# Patient Record
Sex: Male | Born: 1955 | Race: White | Hispanic: No | Marital: Married | Smoking: Never smoker
Health system: Southern US, Community
[De-identification: ages and names within clinical notes are randomized; demographics above are authoritative.]

## PROBLEM LIST (undated history)

## (undated) DIAGNOSIS — I10 Essential (primary) hypertension: Secondary | ICD-10-CM

## (undated) DIAGNOSIS — E119 Type 2 diabetes mellitus without complications: Secondary | ICD-10-CM

## (undated) HISTORY — PX: CARDIAC CATHETERIZATION: SHX172

## (undated) HISTORY — PX: OTHER SURGICAL HISTORY: SHX169

## (undated) HISTORY — PX: KNEE ARTHROSCOPY: SUR90

## (undated) HISTORY — DX: Type 2 diabetes mellitus without complications: E11.9

---

## 1997-11-18 ENCOUNTER — Ambulatory Visit (HOSPITAL_BASED_OUTPATIENT_CLINIC_OR_DEPARTMENT_OTHER): Admission: RE | Admit: 1997-11-18 | Discharge: 1997-11-18 | Payer: Self-pay | Admitting: *Deleted

## 2002-06-13 ENCOUNTER — Encounter: Payer: Self-pay | Admitting: Internal Medicine

## 2002-06-13 ENCOUNTER — Inpatient Hospital Stay (HOSPITAL_COMMUNITY): Admission: EM | Admit: 2002-06-13 | Discharge: 2002-06-16 | Payer: Self-pay | Admitting: Emergency Medicine

## 2007-12-15 ENCOUNTER — Ambulatory Visit (HOSPITAL_COMMUNITY): Admission: RE | Admit: 2007-12-15 | Discharge: 2007-12-15 | Payer: Self-pay | Admitting: Orthopedic Surgery

## 2007-12-24 ENCOUNTER — Ambulatory Visit: Payer: Self-pay | Admitting: Cardiology

## 2007-12-29 ENCOUNTER — Ambulatory Visit: Payer: Self-pay

## 2008-01-01 ENCOUNTER — Ambulatory Visit (HOSPITAL_COMMUNITY): Admission: RE | Admit: 2008-01-01 | Discharge: 2008-01-01 | Payer: Self-pay | Admitting: Orthopedic Surgery

## 2009-01-15 DIAGNOSIS — E669 Obesity, unspecified: Secondary | ICD-10-CM | POA: Insufficient documentation

## 2009-01-15 DIAGNOSIS — I2510 Atherosclerotic heart disease of native coronary artery without angina pectoris: Secondary | ICD-10-CM | POA: Insufficient documentation

## 2009-01-15 DIAGNOSIS — I10 Essential (primary) hypertension: Secondary | ICD-10-CM | POA: Insufficient documentation

## 2009-01-15 DIAGNOSIS — E119 Type 2 diabetes mellitus without complications: Secondary | ICD-10-CM | POA: Insufficient documentation

## 2009-01-15 DIAGNOSIS — R079 Chest pain, unspecified: Secondary | ICD-10-CM | POA: Insufficient documentation

## 2009-02-05 IMAGING — CR DG CHEST 2V
2 series · 2 of 2 positions shown · non-contrast
Comparison: None

CLINICAL DATA: Heel cord contracture.  Preadmit.

CHEST - 2 VIEW

[view not recorded (1 of 2)]
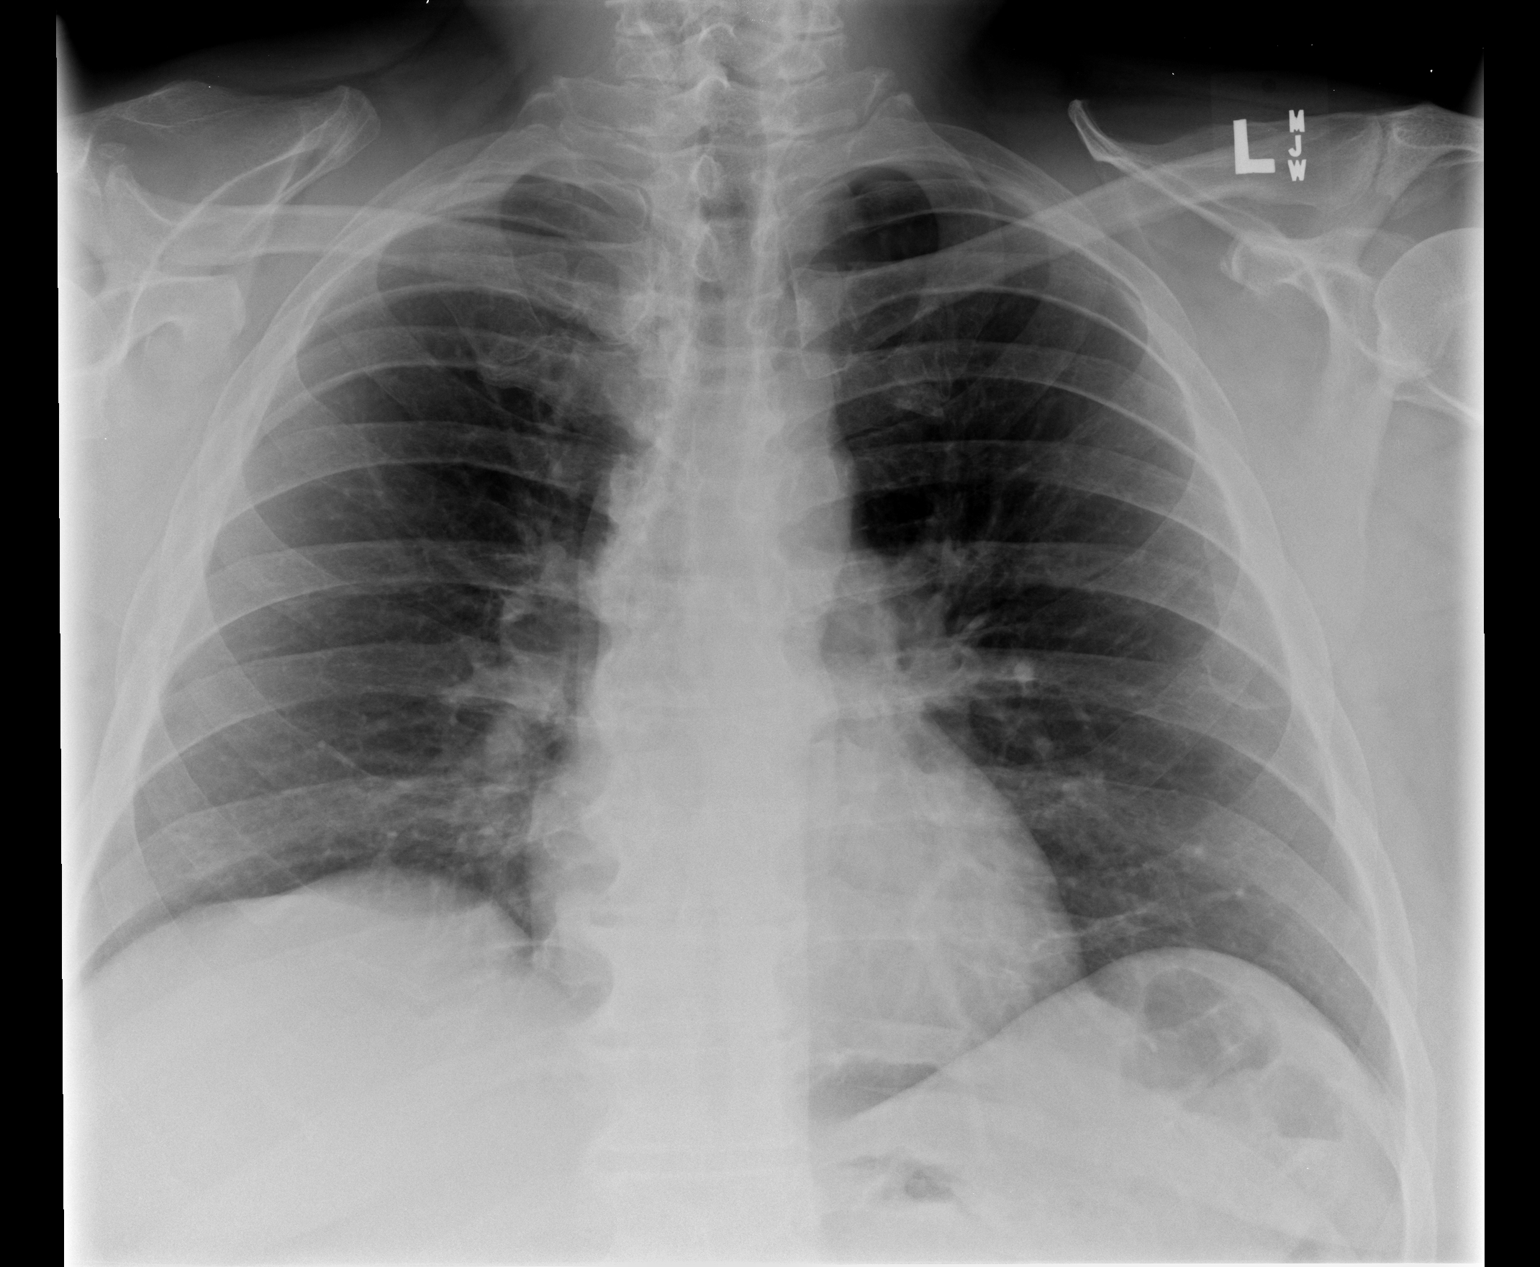

[view not recorded (2 of 2)]
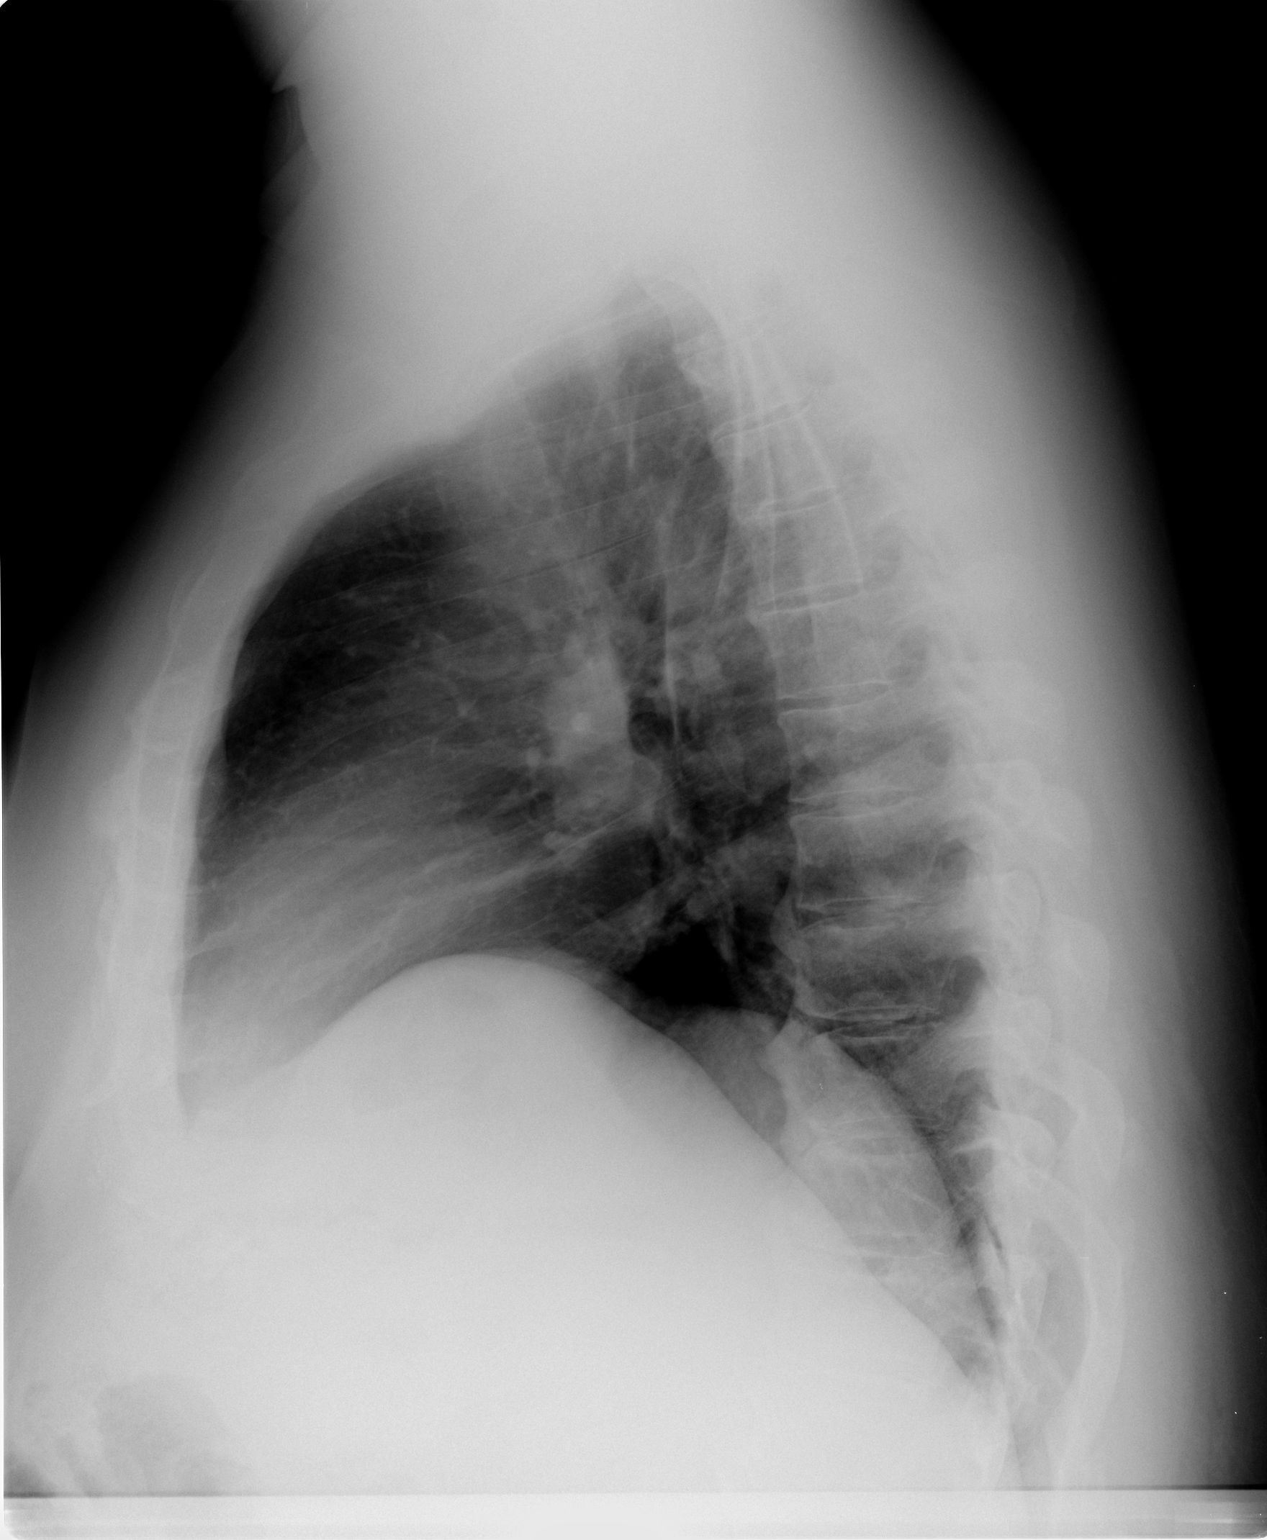

[2 of 2 positions shown; findings below may reference images not displayed]

FINDINGS: Cardiac and mediastinal contours are normal.  There is no
heart failure and the lungs are clear.
IMPRESSION: No active cardiopulmonary disease.

## 2009-02-22 ENCOUNTER — Telehealth (INDEPENDENT_AMBULATORY_CARE_PROVIDER_SITE_OTHER): Payer: Self-pay | Admitting: *Deleted

## 2010-09-12 NOTE — Assessment & Plan Note (Signed)
[facility]                          [facility] OFFICE NOTE   JERRYL, HOLZHAUER                    MRN:          [id number]  DATE:12/24/2007                            DOB:          06/04/1955    REFERRING PHYSICIAN:  Nadara Mustard, MD, [place], [place].   PRIMARY CARE PHYSICIAN:  Dr. Selinda Flavin.   REASON FOR VISIT:  Preop clearance.   HISTORY OF PRESENT ILLNESS:  Mr. Bogden is a very pleasant 55 year old  male patient with a history of probable acute coronary syndrome back in  February 2004.  At that time, he presented with troponins in the 0.13  range.  Cardiac catheterization revealed normal LV function and  scattered disease throughout the LAD.  Specifically, he had a 30-40%  proximal lesion, 30-40% mid lesion and a lesion after the second  diagonal that did not exceed 50%.  The scattered irregularities were  somewhat worrisome and a followup functional test was recommended,  although this was not done at that time.  There was some hypothesis in  his records that indicated he possibly had a transient plaque rupture.  He also has a history of diabetes mellitus, hypertension,  hyperlipidemia.  He was last seen in the office by Dr. Jens Som in June  2004.  He complained of symptoms that sound like an anxiety attack.  He  also noted some dyspnea.  He was set up for followup Cardiolite testing.  This was done in the [place] office and he had a small area of  inferobasal wall ischemia with an EF of 53%.  No scar was noted.  We  have not seen the patient since that time.  He has had significant  problems with recurring diabetic ulcers on his right foot.  This has  been attributed to decreased range of motion from shortening of his  gastrocnemius muscle.  Surgery is planned to correct this.  He  apparently had a preop workup with an abnormal EKG.  His surgery was  cancelled and he was set up for an appointment today.   In  the office, the patient denies any history of chest pain.  He is  quite active without chest heaviness or tightness.  He denies any  exertional jaw or arm discomfort.  He denies any exertional shortness of  breath.  Denies orthopnea or PND.  He denies syncope or palpitations.  He denies any significant pedal edema.   PAST MEDICAL HISTORY:  As outlined above.  In addition, the patient has  a history of left knee surgery x3.   MEDICATIONS:  Metformin 1 g b.i.d., Glipizide 10 mg b.i.d., Metoprolol  50 mg 1/2 tablet b.i.d., Lisinopril 20 mg 1/2 tablet daily,  HCTZ 25 mg daily, Pravastatin 20 mg daily, Multivitamin daily, Aspirin  mg 81 mg daily.   ALLERGIES:  He has reported tachycardia in response to IV DYE in the  past.   SOCIAL HISTORY:  Denies cigarette abuse.  He does note smokeless tobacco  use.  He denies alcohol abuse.  He is married.  He has no children.  He  is unemployed  currently, but has worked as a Aeronautical engineer.   FAMILY HISTORY:  Significant for premature CAD.  His father passed away  at 47 from myocardial infarction.   REVIEW OF SYSTEMS:  Please see HPI.  He denies any fevers, chills,  cough, melena, hematochezia, hematuria, or dysuria.  Rest of the review  of systems are negative.   PHYSICAL EXAMINATION:  GENERAL:  He is a well-nourished and well-  developed male in no distress.  VITAL SIGNS:  Blood pressure is 124/72, pulse 92, weight 284.8 pounds.  HEENT:  Normal.  NECK:  Without JVD.  LYMPH:  Without lymphadenopathy.  ENDOCRINE:  Without thyromegaly.  CARDIAC:  Normal S1 and S2.  Regular rate and rhythm.  LUNGS:  Clear to auscultation bilaterally.  ABDOMEN:  Soft and nontender with normoactive bowel sounds.  No  organomegaly.  EXTREMITIES:  Without edema.  NEUROLOGIC:  He is alert and oriented x3.  Cranial nerves II through XII  grossly intact.  VASCULAR:  Without carotid bruits bilaterally.   Electrocardiogram reveals sinus rhythm, heart rate of 93,  no acute  changes.   ASSESSMENT AND PLAN:  Mr. Figueira is 55 year old male with history of  moderate nonobstructive coronary artery disease by catheterization in  2004 and mildly abnormal Cardiolite study 4 months later, who also has a  history of diabetes mellitus, hypertension, hyperlipidemia and a strong  family history of CAD who is now in need of surgery on his right  gastrocnemius muscle for recurring diabetic ulcers.  He is not having  any symptoms of chest pain or shortness of breath.  In any event, he  certainly has several risk factors as well as a history of coronary  artery disease.  I discussed this case today with Dr. Diona Browner.  We feel  that the patient should undergo Myoview testing prior to clearing him  for surgery.  This has been discussed with the patient, he agrees to  proceed.  We will try to arrange his study as soon as possible and  communicate the results with Dr. Lajoyce Corners when they are made available.  He  is on a good medical regimen.  His metoprolol can certainly be increased  to 50 mg twice a day for better heart rate control, especially in the  perioperative period.  Therefore, I have asked him to  go ahead and make the increase today.  We will bring him back in routine  followup in 6 months with Dr. Diona Browner.  He will return sooner if his  stress test is markedly abnormal.      Tereso Newcomer, PA-C  Electronically Signed      Jonelle Sidle, MD  Electronically Signed   SW/MedQ  DD: 12/24/2007  DT: 12/25/2007  Job #: [id number]   cc:   Nadara Mustard, MD  Selinda Flavin

## 2010-09-12 NOTE — Op Note (Signed)
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   PROCEDURE:  Gastrocnemius recession on the right.   SURGEON:  Nadara Mustard, MD   ANESTHESIA:  General.   ESTIMATED BLOOD LOSS:  Minimal.   ANTIBIOTICS:  A 1 g of Kefzol.   DRAINS:  None.   COMPLICATIONS:  None.   TOURNIQUET TIME:  None.   DISPOSITION:  To PACU in stable condition.   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  He has failed conservative  treatment with attempted heel cord stretching.  He has dorsiflexion 10  degrees short in neutral on the right with dorsiflexion of 20 degrees on  the left. Due to failure of conservative care and pain with activities  of daily living across the forefoot and midfoot, the patient presents at  this time for gastrocnemius recession.  Risks and benefits were  discussed including infection, neurovascular injury, persistent pain,  and need for additional surgery.  The patient states he understands and  wished to proceed at this time.   DESCRIPTION OF PROCEDURE:  The patient was brought to OR room #4 and  underwent a general anesthetic.  After adequate level of anesthesia was  obtained, the patient's right lower extremity was prepped using DuraPrep  and draped into a sterile field.  An incision approximately 2 cm in  length was made longitudinally on the medial aspect of the right leg 15  cm proximal to the medial malleolar tip.  Blunt dissection was carried  down to the  gastrocnemius fascia.  This was resected sharply with a #15  blade knife.  The patient's dorsiflexion went from 10 degrees short in  neutral to dorsiflexion of about 30 degrees.  There was no bleeding.  The wound was irrigated with normal saline.  The skin was closed using  modified vertical mattress suture.  The wound was covered with Adaptic,  orthopedic sponges, sterile Webril, Coban dressing.  The patient was  then extubated, taken to PACU in stable condition.   Plan for discharge to home.  Prescription for Vicodin.  Weightbearing as  tolerated.  Follow up in the office in 2 weeks.      Nadara Mustard, MD  Electronically Signed     MVD/MEDQ  D:  01/01/2008  T:  01/01/2008  Job:  [id number]

## 2010-09-15 NOTE — H&P (Signed)
NAME:  Michael Cantu, Michael Cantu                       ACCOUNT NO.:  [id number]   MEDICAL RECORD NO.:  [id number]                   PATIENT TYPE:  INP   LOCATION:  1823                                 FACILITY:  [facility]   PHYSICIAN:  Doylene Canning. Ladona Ridgel, M.D. [facility]           DATE OF BIRTH:  Aug 23, 1955   DATE OF ADMISSION:  06/13/2002  DATE OF DISCHARGE:                                HISTORY & PHYSICAL   ADMITTING DIAGNOSIS:  Unstable angina with new onset chest pain.   HISTORY OF PRESENT ILLNESS:  The patient is sent in transfer from [facility] with primary physician, Dr. Selinda Flavin.   HISTORY OF PRESENT ILLNESS:  The patient is a 55 year old obese man with  multiple cardiac risk factors who was in his usual state of health when he  today developed mid-sternal, substernal chest pain which was relieved with  nitroglycerin in the emergency department.  The patient for his pain tried  antiacids, but this did not help his symptoms.  His pain subsequently  worsened, and  he presents to the emergency room with nitroglycerin paste,  resulting in resolution of his pain.  The patient denies any pleuritic  symptoms.  The patient notes that his pain occurred after an argument with  his wife.  He denies any nausea, vomiting, diarrhea or constipation.  He  denies any left arm, neck or jaw pain.   MEDICATIONS:  1. Actos.  2. Accuretic.  3. Glucotrol.  4. Prandin.  5. Metformin.   PAST MEDICAL HISTORY:  Notable for diabetes since 1995.  This has been  complicated by a diabetic foot ulcer.  He has had multiple knees surgeries.  He has hypertension.  He has morbid obesity.   ALLERGIES:  Dye resulting in an increase in his heart rate.   SOCIAL HISTORY:  The patient is married.  He dips tobacco.  He denies  ethanol abuse.  He denies much in the way of exercise.   FAMILY HISTORY:  Notable for mother who is age 42, having diabetes and  palpitations.  His father died suddenly at age 51 of a  massive myocardial  infarction.  He has two sisters who are alive and well.  He denies drug use.   REVIEW OF SYSTEMS:  He denies fevers, chills, night sweats or other  constitutional symptoms.  He denies vision or hearing problems, nasal  discharge or voice changes.  He denies any skin rashes or lesions.  He has  chest pain as previously noted.  He denies shortness of breath, dyspnea or  PND.  He does have rare palpitations with dizziness.  He denies cough or  hemoptysis.  He denies dysuria, hematuria or nocturia.  He denies weakness,  numbness or depression.  He has been under increasing stress lately  secondary to a new job, and problems with arguing with his wife.  He denies  arthralgias, arthritis or joint pain.  He denies nausea,  vomiting, diarrhea  or constipation.  He denies any recent weight changes.  The rest of his  review of systems is negative.   PHYSICAL EXAMINATION:  GENERAL:  He is a pleasant, well-appearing middle-  aged obese man, in no distress.  VITAL SIGNS:  Blood pressure 140/70, pulse 100 and regular.  Respirations  20.  Temperature 97.3.  HEENT:  Normocephalic, atraumatic.  Pupils are equal and round.  The  oropharynx is moist.  Sclerae were anicteric.  NECK:  Revealed no jugular venous distention.  There was no thyromegaly.  CARDIOVASCULAR:  Regular rate and rhythm with normal S1 and S2.  There were  no murmurs, rubs or gallops.  LUNGS:  Clear bilaterally to auscultation.  There are no wheezes, rales or  rhonchi.  SKIN:  No rashes or lesions.  ABDOMEN:  Exam was obese, nontender, nondistended.  There is no  organomegaly.  EXTREMITIES:  Demonstrated no cyanosis, clubbing or edema.  NEUROLOGIC:  He is alert and oriented x2 with Cranial nerves II-XII grossly  intact.  Strength 5/5 and symmetric.   EKG:  Demonstrates normal sinus rhythm, with nonspecific T wave abnormality.  Creatinine is normal.  Hemoglobin was normal.  Initial cardiac enzymes  revealed a CK of  102, and MB of 2.4 with initial troponin of 0.02.   IMPRESSION:  1. New onset chest pain, worrisome for unstable angina.  2. Diabetes, on oral agents.  3. Hypertension.  4. Obesity.  5. Strong family history of coronary disease.   DISCUSSION:  We will plan to admit the patient to the hospital and obtain  serial EKG's and cardiac enzymes.  We will give IV nitroglycerin and  heparin.  We will start aspirin and beta-blockers.  Because of his morbid  obesity, we will plan on proceeding with left heart catheterization.                                               Doylene Canning. Ladona Ridgel, M.D. [facility]    GWT/MEDQ  D:  06/13/2002  T:  06/13/2002  Job:  [id number]   cc:   Selinda Flavin  [address]  [place]  [place] [address]  Fax: [phone]

## 2010-09-15 NOTE — Discharge Summary (Signed)
   NAME:  Michael Cantu, Michael Cantu                       ACCOUNT NO.:  [id number]   MEDICAL RECORD NO.:  [id number]                   PATIENT TYPE:  INP   LOCATION:  3708                                 FACILITY:  [facility]   PHYSICIAN:  Doylene Canning. Ladona Ridgel, M.D. [facility]           DATE OF BIRTH:  1955-07-15   DATE OF ADMISSION:  06/13/2002  DATE OF DISCHARGE:  06/16/2002                                 DISCHARGE SUMMARY   PRINCIPAL DIAGNOSIS:  Chest pain.   SECONDARY DIAGNOSES:  1. Obesity.  2. Diabetes.   HISTORY OF PRESENT ILLNESS:  This is a 55 year old male with multiple  coronary risk factors who was in his initial state of health when he  developed midsternal, substernal chest pain which was relieved by  nitroglycerin in the emergency room.  The patient had tried antacids but  this did not help.  Symptoms subsequently worsened.  He presented to the  emergency room with nitro paste resulting in resolution of pain.  He denies  any pleuritic symptoms, notes that pain occurred after an argument with his  wife.  Denies nausea, vomiting, diarrhea, constipation.   PAST MEDICAL HISTORY:  As stated above.   Patient was admitted and underwent a cardiac catheterization.  Cardiac  catheterization reveals overall preserved LV systolic function, severe  moderate segmental abnormalities of the left coronary, left anterior  descending artery.  The patient does have some scattered irregularities.  Medical therapy was decided.   DISPOSITION:  He was discharged to home in stable condition on the following  medications.   1. Coated aspirin 325 mg daily.  2. Lopressor 25 mg b.i.d.  3. Actos 30 mg daily.  4. Glucotrol 10 mg b.i.d.  5. _______ t.i.d. before meals.  6. Accuretic 20/25 daily.  7. He was allowed to resume his Glucophage on Thursday.  8. Tylenol one to two tablets every four to six hours as needed.   DISCHARGE ACTIVITIES:  No heavy lifting or strenuous activities for four  days, no driving for  two.   DIET:  Low fat, low cholesterol, low cholesterol diet.   DISCHARGE INSTRUCTIONS:  He is to call if he develops any drainage or a lump  in his groin.    FOLLOW UP:  He is scheduled to follow up with the physician's assistant at  [facility] at heart center in [place] on March 2 at 1:45 p.m.      Chinita Pester, C.R.N.P. [facility]                 Doylene Canning. Ladona Ridgel, M.D. [facility]    DS/MEDQ  D:  06/16/2002  T:  06/16/2002  Job:  [id number]   cc:   Selinda Flavin  [address]  [place]  [place] [address]  Fax: [phone]   Heart Center  [place]

## 2010-09-15 NOTE — Cardiovascular Report (Signed)
NAME:  Michael Cantu, Michael Cantu                       ACCOUNT NO.:  [id number]   MEDICAL RECORD NO.:  [id number]                   PATIENT TYPE:  INP   LOCATION:  3708                                 FACILITY:  [facility]   PHYSICIAN:  Arturo Morton. Riley Kill, M.D. [facility]         DATE OF BIRTH:  [birth year]   DATE OF PROCEDURE:  06/15/2002  DATE OF DISCHARGE:                              CARDIAC CATHETERIZATION   INDICATIONS:  The patient is a 55 year old gentleman who presented with  chest pain.  He is a smoker.  His electrocardiogram was normal, but he did  have positive troponins x1.  The current study was done to assess coronary  anatomy.   PROCEDURES:  1. Left heart catheterization.  2. Selective coronary arteriography.  3. Selective left ventriculography.   DESCRIPTION OF PROCEDURE:  The procedure was performed from the right  femoral artery using 6 French catheters.  He tolerated the procedure well  and there were no complications. We went back to take more views of the left  main and left anterior descending artery.  The procedure was completed  without complication.   HEMODYNAMIC DATA:  1. Central aortic pressure 119/72 with a mean of 91.  2. Left ventricular pressure 118/8 with a mean of 13.  3. There is no gradient noted on pullback across the aortic valve.   ANGIOGRAPHIC DATA:  1. Ventriculography was performed in the RAO projection.  There is a fair     amount of ventricular ectopy. However, there is no definite wall motion     abnormalities and ejection fraction was greater than 60%.  2. The left main coronary artery was free of critical disease.  3. The LAD courses to the apex.  The LAD takeoff was somewhat difficult to     see and we went ahead and did repeat views of this vessel to highlight     this better. There appears to be perhaps 30-40% narrowing in the proximal     LAD, but it does not appear to be critical.  The mid LAD has about 30-40%     narrowing and there is  narrowing in the LAD just after the second     diagonal branch which is a little bit difficult to grade, but probably     does not exceed 50%.  The distal LAD is a somewhat smaller caliber vessel     with a somewhat typical diabetic appearance of mild bumpy irregularities.  4. There is a large ramus intermedius that is free of critical disease.  5. The circumflex has a small tiny first marginal branch which is small in     caliber.  There is a second and third marginal branch which is moderate     in size and likewise appears to be free of critical disease.  6. The right coronary artery is a large caliber vessel that provides the     posterior descending and posterolateral system.  There is minimal luminal     irregularity throughout the vessel but no high-grade areas of disease.   CONCLUSIONS:  1. Preserved overall left ventricular function.  2. Several moderate segmental abnormalities of the left anterior descending     artery as described above without what appears to be critical narrowing.   DISPOSITION:  The patient does have some scattered irregularities which are  somewhat worrisome.  Discontinuation of smoking would be strongly  recommended as well as lipid-lowering therapy.  Exercise tolerance test to  exclude LAD ischemia would be warranted.  I did review the films with Dr.  Geralynn Rile and he and I are of the same feeling that the LAD does not look  critical.                                                     Arturo Morton. Riley Kill, M.D. [facility]    TDS/MEDQ  D:  06/15/2002  T:  06/15/2002  Job:  [id number]   cc:   CV Laboratory

## 2011-01-31 LAB — CBC
HCT: 44
Hemoglobin: 14.7
MCHC: 33.5
MCV: 84
Platelets: 263
RBC: 5.24
RDW: 14.7
WBC: 9.1

## 2011-01-31 LAB — APTT: aPTT: 28

## 2011-01-31 LAB — COMPREHENSIVE METABOLIC PANEL
ALT: 27
AST: UNDETERMINED
Albumin: 3.6
Alkaline Phosphatase: 88
BUN: 18
CO2: 25
Calcium: 9
Chloride: 100
Creatinine, Ser: 0.96
GFR calc Af Amer: >60
GFR calc non Af Amer: >60
Glucose, Bld: 279 — ABNORMAL HIGH
Potassium: 5
Sodium: 133 — ABNORMAL LOW
Total Bilirubin: UNDETERMINED
Total Protein: 7.1

## 2011-01-31 LAB — PROTIME-INR
INR: 1
Prothrombin Time: 13.2

## 2013-01-13 ENCOUNTER — Encounter (HOSPITAL_COMMUNITY): Payer: Self-pay | Admitting: Pharmacy Technician

## 2013-01-19 NOTE — Patient Instructions (Addendum)
Your procedure is scheduled on: 01/26/2013  Report to [facility] at  800  AM.  Call this number if you have problems the morning of surgery: [phone]   Do not eat food or drink liquids :After Midnight.      Take these medicines the morning of surgery with A SIP OF WATER: lisinopril   Do not wear jewelry, make-up or nail polish.  Do not wear lotions, powders, or perfumes.   Do not shave 48 hours prior to surgery.  Do not bring valuables to the hospital.  Contacts, dentures or bridgework may not be worn into surgery.  Leave suitcase in the car. After surgery it may be brought to your room.  For patients admitted to the hospital, checkout time is 11:00 AM the day of discharge.   Patients discharged the day of surgery will not be allowed to drive home.  :     Please read over the following fact sheets that you were given: Coughing and Deep Breathing, Surgical Site Infection Prevention, Anesthesia Post-op Instructions and Care and Recovery After Surgery    Cataract A cataract is a clouding of the lens of the eye. When a lens becomes cloudy, vision is reduced based on the degree and nature of the clouding. Many cataracts reduce vision to some degree. Some cataracts make people more near-sighted as they develop. Other cataracts increase glare. Cataracts that are ignored and become worse can sometimes look white. The white color can be seen through the pupil. CAUSES   Aging. However, cataracts may occur at any age, even in newborns.   Certain drugs.   Trauma to the eye.   Certain diseases such as diabetes.   Specific eye diseases such as chronic inflammation inside the eye or a sudden attack of a rare form of glaucoma.   Inherited or acquired medical problems.  SYMPTOMS   Gradual, progressive drop in vision in the affected eye.   Severe, rapid visual loss. This most often happens when trauma is the cause.  DIAGNOSIS  To detect a cataract, an eye doctor examines the lens. Cataracts  are best diagnosed with an exam of the eyes with the pupils enlarged (dilated) by drops.  TREATMENT  For an early cataract, vision may improve by using different eyeglasses or stronger lighting. If that does not help your vision, surgery is the only effective treatment. A cataract needs to be surgically removed when vision loss interferes with your everyday activities, such as driving, reading, or watching TV. A cataract may also have to be removed if it prevents examination or treatment of another eye problem. Surgery removes the cloudy lens and usually replaces it with a substitute lens (intraocular lens, IOL).  At a time when both you and your doctor agree, the cataract will be surgically removed. If you have cataracts in both eyes, only one is usually removed at a time. This allows the operated eye to heal and be out of danger from any possible problems after surgery (such as infection or poor wound healing). In rare cases, a cataract may be doing damage to your eye. In these cases, your caregiver may advise surgical removal right away. The vast majority of people who have cataract surgery have better vision afterward. HOME CARE INSTRUCTIONS  If you are not planning surgery, you may be asked to do the following:  Use different eyeglasses.   Use stronger or brighter lighting.   Ask your eye doctor about reducing your medicine dose or changing medicines if  it is thought that a medicine caused your cataract. Changing medicines does not make the cataract go away on its own.   Become familiar with your surroundings. Poor vision can lead to injury. Avoid bumping into things on the affected side. You are at a higher risk for tripping or falling.   Exercise extreme care when driving or operating machinery.   Wear sunglasses if you are sensitive to bright light or experiencing problems with glare.  SEEK IMMEDIATE MEDICAL CARE IF:   You have a worsening or sudden vision loss.   You notice redness,  swelling, or increasing pain in the eye.   You have a fever.  Document Released: 04/16/2005 Document Revised: 04/05/2011 Document Reviewed: 12/08/2010 [facility] Patient Information 2012 [place].PATIENT INSTRUCTIONS POST-ANESTHESIA  IMMEDIATELY FOLLOWING SURGERY:  Do not drive or operate machinery for the first twenty four hours after surgery.  Do not make any important decisions for twenty four hours after surgery or while taking narcotic pain medications or sedatives.  If you develop intractable nausea and vomiting or a severe headache please notify your doctor immediately.  FOLLOW-UP:  Please make an appointment with your surgeon as instructed. You do not need to follow up with anesthesia unless specifically instructed to do so.  WOUND CARE INSTRUCTIONS (if applicable):  Keep a dry clean dressing on the anesthesia/puncture wound site if there is drainage.  Once the wound has quit draining you may leave it open to air.  Generally you should leave the bandage intact for twenty four hours unless there is drainage.  If the epidural site drains for more than 36-48 hours please call the anesthesia department.  QUESTIONS?:  Please feel free to call your physician or the hospital operator if you have any questions, and they will be happy to assist you.

## 2013-01-20 ENCOUNTER — Encounter (HOSPITAL_COMMUNITY)
Admission: RE | Admit: 2013-01-20 | Discharge: 2013-01-20 | Disposition: A | Discharge status: Home / Self Care | Payer: Medicare Other | Source: Ambulatory Visit | Attending: Ophthalmology | Admitting: Ophthalmology

## 2013-01-20 ENCOUNTER — Encounter (HOSPITAL_COMMUNITY): Payer: Self-pay

## 2013-01-20 DIAGNOSIS — Z01810 Encounter for preprocedural cardiovascular examination: Secondary | ICD-10-CM | POA: Insufficient documentation

## 2013-01-20 DIAGNOSIS — Z01812 Encounter for preprocedural laboratory examination: Secondary | ICD-10-CM | POA: Insufficient documentation

## 2013-01-20 DIAGNOSIS — Z01818 Encounter for other preprocedural examination: Secondary | ICD-10-CM | POA: Insufficient documentation

## 2013-01-20 HISTORY — DX: Essential (primary) hypertension: I10

## 2013-01-20 LAB — BASIC METABOLIC PANEL
CO2: 24 mEq/L (ref 19–32)
Chloride: 99 mEq/L (ref 96–112)
Creatinine, Ser: 0.99 mg/dL (ref 0.50–1.35)
GFR calc Af Amer: >90 mL/min (ref >90)
Potassium: 5 mEq/L (ref 3.5–5.1)

## 2013-01-20 LAB — HEMOGLOBIN AND HEMATOCRIT, BLOOD: HCT: 38.9 % — ABNORMAL LOW (ref 39.0–52.0)

## 2013-01-20 NOTE — Progress Notes (Signed)
01/20/13 1234  OBSTRUCTIVE SLEEP APNEA  Have you ever been diagnosed with sleep apnea through a sleep study? No  Do you snore loudly (loud enough to be heard through closed doors)?  0  Do you often feel tired, fatigued, or sleepy during the daytime? 0  Has anyone observed you stop breathing during your sleep? 0  Do you have, or are you being treated for high blood pressure? 1  BMI more than 35 kg/m2? 1  Age over 57 years old? 1  Neck circumference greater than 40 cm/18 inches? 0  Gender: 1  Obstructive Sleep Apnea Score 4  Score 4 or greater  Results sent to PCP

## 2013-01-23 MED ORDER — CYCLOPENTOLATE-PHENYLEPHRINE OP SOLN OPTIME - NO CHARGE
OPHTHALMIC | Status: AC
  Filled 2013-01-23: qty 2

## 2013-01-23 MED ORDER — TETRACAINE 0.5 % OP SOLN OPTIME - NO CHARGE
OPHTHALMIC | Status: AC
  Filled 2013-01-23: qty 2

## 2013-01-26 ENCOUNTER — Encounter (HOSPITAL_COMMUNITY)
Admission: RE | Disposition: A | Discharge status: Home / Self Care | Payer: Self-pay | Source: Ambulatory Visit | Attending: Ophthalmology

## 2013-01-26 ENCOUNTER — Ambulatory Visit (HOSPITAL_COMMUNITY): Payer: Medicare Other | Admitting: Anesthesiology

## 2013-01-26 ENCOUNTER — Encounter (HOSPITAL_COMMUNITY): Payer: Self-pay | Admitting: *Deleted

## 2013-01-26 ENCOUNTER — Ambulatory Visit (HOSPITAL_COMMUNITY)
Admission: RE | Admit: 2013-01-26 | Discharge: 2013-01-26 | Disposition: A | Discharge status: Home / Self Care | Payer: Medicare Other | Source: Ambulatory Visit | Attending: Ophthalmology | Admitting: Ophthalmology

## 2013-01-26 ENCOUNTER — Encounter (HOSPITAL_COMMUNITY): Payer: Self-pay | Admitting: Anesthesiology

## 2013-01-26 DIAGNOSIS — H2510 Age-related nuclear cataract, unspecified eye: Secondary | ICD-10-CM | POA: Insufficient documentation

## 2013-01-26 DIAGNOSIS — Z01812 Encounter for preprocedural laboratory examination: Secondary | ICD-10-CM | POA: Insufficient documentation

## 2013-01-26 DIAGNOSIS — E119 Type 2 diabetes mellitus without complications: Secondary | ICD-10-CM | POA: Insufficient documentation

## 2013-01-26 DIAGNOSIS — I10 Essential (primary) hypertension: Secondary | ICD-10-CM | POA: Insufficient documentation

## 2013-01-26 DIAGNOSIS — Z794 Long term (current) use of insulin: Secondary | ICD-10-CM | POA: Insufficient documentation

## 2013-01-26 HISTORY — PX: CATARACT EXTRACTION W/PHACO: SHX586

## 2013-01-26 LAB — GLUCOSE, CAPILLARY: Glucose-Capillary: 134 mg/dL — ABNORMAL HIGH (ref 70–99)

## 2013-01-26 SURGERY — PHACOEMULSIFICATION, CATARACT, WITH IOL INSERTION
Anesthesia: Monitor Anesthesia Care | Site: Eye | Laterality: Right | Wound class: Clean

## 2013-01-26 MED ORDER — POVIDONE-IODINE 5 % OP SOLN
OPHTHALMIC | Status: DC | PRN
  Administered 2013-01-26: 1 via OPHTHALMIC

## 2013-01-26 MED ORDER — MIDAZOLAM HCL 2 MG/2ML IJ SOLN
INTRAMUSCULAR | Status: AC
  Filled 2013-01-26: qty 2

## 2013-01-26 MED ORDER — NA HYALUR & NA CHOND-NA HYALUR 0.55-0.5 ML IO KIT
PACK | INTRAOCULAR | Status: DC | PRN
  Administered 2013-01-26: 1 via OPHTHALMIC

## 2013-01-26 MED ORDER — CYCLOPENTOLATE-PHENYLEPHRINE 0.2-1 % OP SOLN
1.0000 [drp] | OPHTHALMIC | Status: AC
  Administered 2013-01-26 (×3): 1 [drp] via OPHTHALMIC

## 2013-01-26 MED ORDER — ONDANSETRON HCL 4 MG/2ML IJ SOLN: 4.0000 mg | Freq: Once | INTRAMUSCULAR | Status: DC | PRN

## 2013-01-26 MED ORDER — LIDOCAINE HCL 3.5 % OP GEL
OPHTHALMIC | Status: AC
  Filled 2013-01-26: qty 1

## 2013-01-26 MED ORDER — LACTATED RINGERS IV SOLN
INTRAVENOUS | Status: DC
  Administered 2013-01-26: 1000 mL via INTRAVENOUS

## 2013-01-26 MED ORDER — MIDAZOLAM HCL 5 MG/5ML IJ SOLN
INTRAMUSCULAR | Status: DC | PRN
  Administered 2013-01-26: 2 mg via INTRAVENOUS

## 2013-01-26 MED ORDER — BSS IO SOLN
INTRAOCULAR | Status: DC | PRN
  Administered 2013-01-26: 15 mL via INTRAOCULAR

## 2013-01-26 MED ORDER — EPINEPHRINE HCL 1 MG/ML IJ SOLN
INTRAOCULAR | Status: DC | PRN
  Administered 2013-01-26: 10:00:00

## 2013-01-26 MED ORDER — FENTANYL CITRATE 0.05 MG/ML IJ SOLN
25.0000 ug | INTRAMUSCULAR | Status: AC
  Administered 2013-01-26: 25 ug via INTRAVENOUS

## 2013-01-26 MED ORDER — FENTANYL CITRATE 0.05 MG/ML IJ SOLN: 25.0000 ug | INTRAMUSCULAR | Status: DC | PRN

## 2013-01-26 MED ORDER — MIDAZOLAM HCL 2 MG/2ML IJ SOLN
1.0000 mg | INTRAMUSCULAR | Status: DC | PRN
  Administered 2013-01-26: 2 mg via INTRAVENOUS

## 2013-01-26 MED ORDER — TETRACAINE HCL 0.5 % OP SOLN
OPHTHALMIC | Status: DC | PRN
  Administered 2013-01-26: 1 [drp] via OPHTHALMIC

## 2013-01-26 MED ORDER — EPINEPHRINE HCL 1 MG/ML IJ SOLN
INTRAMUSCULAR | Status: AC
  Filled 2013-01-26: qty 1

## 2013-01-26 MED ORDER — FENTANYL CITRATE 0.05 MG/ML IJ SOLN
INTRAMUSCULAR | Status: AC
  Filled 2013-01-26: qty 2

## 2013-01-26 MED ORDER — LIDOCAINE HCL 3.5 % OP GEL
OPHTHALMIC | Status: DC | PRN
  Administered 2013-01-26: 1 via OPHTHALMIC

## 2013-01-26 SURGICAL SUPPLY — 27 items
CAPSULAR TENSION RING-AMO (OPHTHALMIC RELATED) IMPLANT
CLOTH BEACON ORANGE TIMEOUT ST (SAFETY) ×1 IMPLANT
GLOVE BIO SURGEON STRL SZ7.5 (GLOVE) IMPLANT
GLOVE BIOGEL M 6.5 STRL (GLOVE) IMPLANT
GLOVE BIOGEL PI IND STRL 6.5 (GLOVE) ×1 IMPLANT
GLOVE BIOGEL PI IND STRL 7.0 (GLOVE) ×1 IMPLANT
GLOVE BIOGEL PI INDICATOR 6.5 (GLOVE) ×1
GLOVE BIOGEL PI INDICATOR 7.0 (GLOVE) ×1
GLOVE ECLIPSE 6.5 STRL STRAW (GLOVE) IMPLANT
GLOVE ECLIPSE 7.5 STRL STRAW (GLOVE) IMPLANT
GLOVE EXAM NITRILE LRG STRL (GLOVE) IMPLANT
GLOVE EXAM NITRILE MD LF STRL (GLOVE) IMPLANT
GLOVE SKINSENSE NS SZ6.5 (GLOVE)
GLOVE SKINSENSE NS SZ7.0 (GLOVE)
GLOVE SKINSENSE STRL SZ6.5 (GLOVE) IMPLANT
GLOVE SKINSENSE STRL SZ7.0 (GLOVE) IMPLANT
INST SET CATARACT ~~LOC~~ (KITS) ×2 IMPLANT
KIT VITRECTOMY (OPHTHALMIC RELATED) IMPLANT
PAD ARMBOARD 7.5X6 YLW CONV (MISCELLANEOUS) ×2 IMPLANT
PROC W NO LENS (INTRAOCULAR LENS)
PROC W SPEC LENS (INTRAOCULAR LENS)
PROCESS W NO LENS (INTRAOCULAR LENS) IMPLANT
PROCESS W SPEC LENS (INTRAOCULAR LENS) IMPLANT
RING MALYGIN (MISCELLANEOUS) IMPLANT
SIGHTPATH CAT PROC W REG LENS (Ophthalmic Related) ×2 IMPLANT
VISCOELASTIC ADDITIONAL (OPHTHALMIC RELATED) IMPLANT
WATER STERILE IRR 250ML POUR (IV SOLUTION) ×1 IMPLANT

## 2013-01-26 NOTE — H&P (Signed)
I have reviewed the pre printed H&P, the patient was re-examined, and I have identified no significant interval changes in the patient's medical condition.  There is no change in the plan of care since the history and physical of record. 

## 2013-01-26 NOTE — Anesthesia Preprocedure Evaluation (Signed)
Anesthesia Evaluation  Patient identified by MRN, date of birth, ID band Patient awake    Reviewed: Allergy & Precautions, H&P , NPO status , Patient's Chart, lab work & pertinent test results  Airway Mallampati: II TM Distance: >3 FB     Dental  (+) Teeth Intact   Pulmonary  breath sounds clear to auscultation        Cardiovascular hypertension, Pt. on medications + CAD Rhythm:Regular Rate:Normal     Neuro/Psych    GI/Hepatic   Endo/Other  diabetes, Type 2, Insulin DependentMorbid obesity  Renal/GU      Musculoskeletal   Abdominal   Peds  Hematology   Anesthesia Other Findings   Reproductive/Obstetrics                           Anesthesia Physical Anesthesia Plan  ASA: III  Anesthesia Plan: MAC   Post-op Pain Management:    Induction: Intravenous  Airway Management Planned: Nasal Cannula  Additional Equipment:   Intra-op Plan:   Post-operative Plan:   Informed Consent: I have reviewed the patients History and Physical, chart, labs and discussed the procedure including the risks, benefits and alternatives for the proposed anesthesia with the patient or authorized representative who has indicated his/her understanding and acceptance.     Plan Discussed with:   Anesthesia Plan Comments:         Anesthesia Quick Evaluation

## 2013-01-26 NOTE — Op Note (Signed)
See scanned note.

## 2013-01-26 NOTE — Transfer of Care (Signed)
Immediate Anesthesia Transfer of Care Note  Patient: Michael Cantu  Procedure(s) Performed: Procedure(s) with comments: CATARACT EXTRACTION PHACO AND INTRAOCULAR LENS PLACEMENT (IOC) (Right) - CDE:17.43  Patient Location: Short Stay  Anesthesia Type:MAC  Level of Consciousness: awake, alert , oriented and patient cooperative  Airway & Oxygen Therapy: Patient Spontanous Breathing and Patient connected to nasal cannula oxygen  Post-op Assessment: Report given to PACU RN, Post -op Vital signs reviewed and stable and Patient moving all extremities  Post vital signs: Reviewed and stable  Complications: No apparent anesthesia complications

## 2013-01-26 NOTE — Anesthesia Postprocedure Evaluation (Signed)
  Anesthesia Post-op Note  Patient: Michael Cantu  Procedure(s) Performed: Procedure(s) with comments: CATARACT EXTRACTION PHACO AND INTRAOCULAR LENS PLACEMENT (IOC) (Right) - CDE:17.43  Patient Location: Short Stay  Anesthesia Type:MAC  Level of Consciousness: awake, alert , oriented and patient cooperative  Airway and Oxygen Therapy: Patient Spontanous Breathing  Post-op Pain: none  Post-op Assessment: Post-op Vital signs reviewed, Patient's Cardiovascular Status Stable, Respiratory Function Stable, Patent Airway, No signs of Nausea or vomiting and Pain level controlled  Post-op Vital Signs: Reviewed and stable  Complications: No apparent anesthesia complications

## 2013-01-26 NOTE — Brief Op Note (Signed)
01/26/2013  10:11 AM  PATIENT:  Mickel Fuchs  57 y.o. male  PRE-OPERATIVE DIAGNOSIS:  nuclear cataract right eye; decreased vision right eye; difficulty performing daily activities  POST-OPERATIVE DIAGNOSIS:  nuclear cataract right eye; decreased vision right eye;   PROCEDURE:  Procedure(s): CATARACT EXTRACTION PHACO AND INTRAOCULAR LENS PLACEMENT (IOC)  SURGEON:  Surgeon(s): Susa Simmonds, MD  ASSISTANTS: Sherrell Puller   ANESTHESIA STAFF: Anesthesiologist: Laurene Footman, MD CRNA: Despina Hidden, CRNA  ANESTHESIA:   topical and MAC  REQUESTED LENS POWER:18.5  LENS IMPLANT INFORMATION: Alcon [id number] s/n [id number]  Exp 05/2015  CUMULATIVE DISSIPATED ENERGY:17.43  INDICATIONS:see office H&P on chart  OP FINDINGS:dense NS  COMPLICATIONS:None  DICTATION #: see scanned note  PLAN OF CARE: as above   PATIENT DISPOSITION:  Short Stay

## 2013-01-27 ENCOUNTER — Encounter (HOSPITAL_COMMUNITY): Payer: Self-pay | Admitting: Ophthalmology

## 2013-03-02 NOTE — H&P (Signed)
Please see scanned office H&P

## 2013-12-04 ENCOUNTER — Ambulatory Visit (HOSPITAL_COMMUNITY)
Admission: RE | Admit: 2013-12-04 | Discharge: 2013-12-04 | Disposition: A | Discharge status: Home / Self Care | Payer: Medicare Other | Source: Ambulatory Visit | Attending: Surgery | Admitting: Surgery

## 2013-12-04 NOTE — Discharge Instructions (Signed)
PICC Insertion, Care After  Refer to this sheet in the next few weeks. These instructions provide you with information on caring for yourself after your procedure. Your health care provider may also give you more specific instructions. Your treatment has been planned according to current medical practices, but problems sometimes occur. Call your health care provider if you have any problems or questions after your procedure.  WHAT TO EXPECT AFTER THE PROCEDURE  After your procedure, it is typical to have the following:   Mild discomfort at the insertion site. This should not last more than a day.  HOME CARE INSTRUCTIONS   Rest at home for the remainder of the day after the procedure.   You may bend your arm and move it freely. If your PICC is near or at the bend of your elbow, avoid activity with repeated motion at the elbow.   Avoid lifting heavy objects as instructed by your health care provider.   Avoid using a crutch with the arm on the same side as your PICC. You may need to use a walker.  Bandage Care   Keep your PICC bandage (dressing) clean and dry to prevent infection.   Ask your health care provider when you may shower. To keep the dressing dry, cover the PICC with plastic wrap and tape before showering. If the dressing does become wet, replace it right after the shower.   Do not soak in the bath, swim, or use hot tubs when you have a PICC.   Change the PICC dressing as instructed by your health care provider.   Change your PICC dressing if it becomes loose or wet.  General PICC Care   Check the PICC insertion site daily for leakage, redness, swelling, or pain.   Flush the PICC as directed by your health care provider. Let your health care provider know right away if the PICC is difficult to flush or does not flush. Do not use force to flush the PICC.   Do not use a syringe that is less than 10 mL to flush the PICC.   Never pull or tug on the PICC.   Avoid blood pressure checks on the arm  with the PICC.   Keep your PICC identification card with you at all times.   Do not take the PICC out yourself. Only a trained health care professional should remove the PICC.  SEEK MEDICAL CARE IF:   You have pain in your arm, ear, face, or teeth.   You have fever or chills.   You have drainage from the PICC insertion site.   You have redness or palpate a "cord" around the PICC insertion site.   You cannot flush the catheter.  SEEK IMMEDIATE MEDICAL CARE IF:   You have swelling in the arm in which the PICC is inserted.  Document Released: 02/04/2013 Document Revised: 04/21/2013 Document Reviewed: 02/04/2013  [employer] Patient Information 2015 [employer]. This information is not intended to replace advice given to you by your health care provider. Make sure you discuss any questions you have with your health care provider.

## 2014-05-17 DIAGNOSIS — E08621 Diabetes mellitus due to underlying condition with foot ulcer: Secondary | ICD-10-CM | POA: Diagnosis not present

## 2014-06-09 DIAGNOSIS — E11621 Type 2 diabetes mellitus with foot ulcer: Secondary | ICD-10-CM | POA: Diagnosis not present

## 2014-06-23 ENCOUNTER — Ambulatory Visit (INDEPENDENT_AMBULATORY_CARE_PROVIDER_SITE_OTHER): Payer: Medicare Other | Admitting: Podiatry

## 2014-06-23 ENCOUNTER — Encounter: Payer: Self-pay | Admitting: Podiatry

## 2014-06-23 ENCOUNTER — Other Ambulatory Visit: Payer: Self-pay | Admitting: Podiatry

## 2014-06-23 ENCOUNTER — Ambulatory Visit (INDEPENDENT_AMBULATORY_CARE_PROVIDER_SITE_OTHER): Payer: Medicare Other

## 2014-06-23 VITALS — BP 136/86 | HR 69 | Resp 12

## 2014-06-23 DIAGNOSIS — B351 Tinea unguium: Secondary | ICD-10-CM | POA: Diagnosis not present

## 2014-06-23 DIAGNOSIS — L89891 Pressure ulcer of other site, stage 1: Secondary | ICD-10-CM | POA: Diagnosis not present

## 2014-06-23 DIAGNOSIS — R52 Pain, unspecified: Secondary | ICD-10-CM

## 2014-06-23 DIAGNOSIS — L97421 Non-pressure chronic ulcer of left heel and midfoot limited to breakdown of skin: Secondary | ICD-10-CM

## 2014-06-23 NOTE — Progress Notes (Signed)
Subjective:     Patient ID: Michael Cantu, male   DOB: 1955/12/17, 59 y.o.   MRN: [id number]  HPI patient states these had wound care for an open area left for about 9 months but it's doing much better now but they're try to prevent this from occurring again and he did lose his big toe right second toe right and he's had a long-term 20 year history of diabetes that was in poor control and currently is improved but still runs an A1c of around 8 point with long-term neuropathy   Review of Systems  All other systems reviewed and are negative.      Objective:   Physical Exam  Constitutional: He is oriented to person, place, and time.  Musculoskeletal: Normal range of motion.  Neurological: He is oriented to person, place, and time.  Skin: Skin is warm and dry.   I noted diminishment of vascular status which I questioned him about he states he sees a vascular doctor but there is nothing they can do to improve his distal circulation. He does have on his left foot small open area on the lateral side that has Iodosorb on it that measures about 1 cm x 1 cm. It is localized with no proximal edema erythema drainage and no odor noted. The right has healed well secondary to removal of the first metatarsal right hallux proximally one half years ago     Assessment:     Ulceration left lateral foot that's doing much better and healed on the right    Plan:     H&P and x-ray reviewed and at this time I recommended cleaning the area up and reapplied Iodosorb today with sterile dressing and diabetic shoes. This should heal uneventfully but because of circulatory issues at neuropathy and long-term diabetes he is can have to be very diligent in inspecting his feet every day which I explained to him today casted for diabetic shoes

## 2014-06-23 NOTE — Progress Notes (Signed)
   Subjective:    Patient ID: Michael Cantu, male    DOB: 07/25/1955, 59 y.o.   MRN: [id number]  HPI PT STATED WOUND CARE FOR ABOUT 9 MONTHS FOR THE LT FOOT HAVE AN OPEN SORE ON THE LATERAL SIDE BUT IS GETTING OR LOOKING MUCH BETTER. TRIED USING IOSORB AND IT HELP SOME.   Review of Systems  All other systems reviewed and are negative.      Objective:   Physical Exam        Assessment & Plan:

## 2014-07-05 DIAGNOSIS — E11621 Type 2 diabetes mellitus with foot ulcer: Secondary | ICD-10-CM | POA: Diagnosis not present

## 2014-08-25 ENCOUNTER — Ambulatory Visit (INDEPENDENT_AMBULATORY_CARE_PROVIDER_SITE_OTHER): Payer: Medicare Other | Admitting: Podiatry

## 2014-08-25 DIAGNOSIS — L97421 Non-pressure chronic ulcer of left heel and midfoot limited to breakdown of skin: Secondary | ICD-10-CM

## 2014-08-25 DIAGNOSIS — M2010 Hallux valgus (acquired), unspecified foot: Secondary | ICD-10-CM

## 2014-08-25 DIAGNOSIS — R52 Pain, unspecified: Secondary | ICD-10-CM

## 2014-08-25 NOTE — Patient Instructions (Signed)

## 2014-08-25 NOTE — Progress Notes (Signed)
Patient ID: Michael Cantu, male   DOB: 05-29-1955, 59 y.o.   MRN: [id number] PATIENT PRESENTS FOR DIABETIC SHOE PICK UP.  SHOES ARE TRIED ON FOR GOOD FIT. PATIENT RECEIVED 1 PAIR NEW BALANCE 813 LACE IN BROWN MENS 11 EXTRA WIDE WITH 3 PAIRS CUSTOM DIABETIC INSERTS.  WRITTEN BREAK IN AND WEAR INSTRUCTION GIVEN.

## 2014-08-26 NOTE — Progress Notes (Signed)
Subjective:     Patient ID: Michael Cantu, male   DOB: 27-Feb-1956, 59 y.o.   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

## 2014-09-15 DIAGNOSIS — E119 Type 2 diabetes mellitus without complications: Secondary | ICD-10-CM | POA: Diagnosis not present

## 2014-10-11 DIAGNOSIS — L409 Psoriasis, unspecified: Secondary | ICD-10-CM | POA: Diagnosis not present

## 2014-10-11 DIAGNOSIS — I10 Essential (primary) hypertension: Secondary | ICD-10-CM | POA: Diagnosis not present

## 2014-10-11 DIAGNOSIS — E6609 Other obesity due to excess calories: Secondary | ICD-10-CM | POA: Diagnosis not present

## 2014-10-11 DIAGNOSIS — E1165 Type 2 diabetes mellitus with hyperglycemia: Secondary | ICD-10-CM | POA: Diagnosis not present

## 2014-10-18 DIAGNOSIS — I10 Essential (primary) hypertension: Secondary | ICD-10-CM | POA: Diagnosis not present

## 2014-10-18 DIAGNOSIS — E1165 Type 2 diabetes mellitus with hyperglycemia: Secondary | ICD-10-CM | POA: Diagnosis not present

## 2014-10-18 DIAGNOSIS — L409 Psoriasis, unspecified: Secondary | ICD-10-CM | POA: Diagnosis not present

## 2014-11-17 ENCOUNTER — Ambulatory Visit (INDEPENDENT_AMBULATORY_CARE_PROVIDER_SITE_OTHER): Payer: Medicare Other

## 2014-11-17 ENCOUNTER — Ambulatory Visit (INDEPENDENT_AMBULATORY_CARE_PROVIDER_SITE_OTHER): Payer: Medicare Other | Admitting: Podiatry

## 2014-11-17 ENCOUNTER — Encounter: Payer: Self-pay | Admitting: Podiatry

## 2014-11-17 ENCOUNTER — Other Ambulatory Visit: Payer: Self-pay | Admitting: Podiatry

## 2014-11-17 VITALS — BP 118/76 | HR 115 | Resp 16

## 2014-11-17 DIAGNOSIS — L97511 Non-pressure chronic ulcer of other part of right foot limited to breakdown of skin: Secondary | ICD-10-CM

## 2014-11-19 ENCOUNTER — Encounter: Payer: Self-pay | Admitting: Podiatry

## 2014-11-19 NOTE — Progress Notes (Signed)
Patient ID: Michael Cantu, male   DOB: 03/16/1956, 59 y.o.   MRN: [id number]  Subjective:  59 year old male presents the office today for concerns of an ulceration on the right foot which has been present for greater than one month. He is unsure of how the wound started. He denies any redness or drainage on the wound site. He's been applying a dressing daily. He denies any systemic complaints as fevers, chills, nausea, vomiting. Denies calf pain, chest pain, soreness of breath. No other complaints at this time.  Objective:  AAO 3, NAD DP/PT pulses decreased bilaterally, CRT less than 3 seconds Protective sensation decreased with Simms Weinstein monofilament Right foot submetatarsal 3 granular ulceration with hyperkeratotic periwound. After debridement and wound measures 0.8 x 0.6 cm the superficial. The wound base is granular. There is no swelling erythema, ascending cellulitis, fluctuance, crepitus, malodor. There is no drainage or purulence. There does appear to be subluxation of the digits at the MTPJ chronically. No other open lesions or pre-ulcer lesions identified bilaterally. No pain with Compression, swelling, warmth, erythema.  Assessment: 59 year old male right submetatarsal 3 ulceration, noninfected  Plan: -Treatment options discussed including all alternatives, risks, and complications -X-rays were obtained and reviewed with the patient.  -Wound sharply debrided without consultation to healthy, bleeding, granular wound base. Iodosorb was applied followed by dry sterile dressing. Continue daily dressing changes at home. He states that he has this ointment at home as well. -Dispensed offloading pads -Monitor for any clinical signs or symptoms of infection and directed to call the office immediately should any occur or go to the ER. -Follow-up 2 weeks or sooner if any problems arise. In the meantime, encouraged to call the office with any questions, concerns, change in symptoms.    Celesta Gentile, DPM

## 2014-12-03 ENCOUNTER — Ambulatory Visit (INDEPENDENT_AMBULATORY_CARE_PROVIDER_SITE_OTHER): Payer: Medicare Other | Admitting: Podiatry

## 2014-12-03 ENCOUNTER — Encounter: Payer: Self-pay | Admitting: Podiatry

## 2014-12-03 VITALS — BP 84/45 | HR 109 | Resp 18

## 2014-12-03 DIAGNOSIS — M79676 Pain in unspecified toe(s): Secondary | ICD-10-CM

## 2014-12-03 DIAGNOSIS — E1140 Type 2 diabetes mellitus with diabetic neuropathy, unspecified: Secondary | ICD-10-CM

## 2014-12-03 DIAGNOSIS — B351 Tinea unguium: Secondary | ICD-10-CM

## 2014-12-03 DIAGNOSIS — L97511 Non-pressure chronic ulcer of other part of right foot limited to breakdown of skin: Secondary | ICD-10-CM

## 2014-12-03 DIAGNOSIS — E1149 Type 2 diabetes mellitus with other diabetic neurological complication: Secondary | ICD-10-CM

## 2014-12-03 NOTE — Patient Instructions (Signed)
Continue daily dressing changes. Monitor for any signs/symptoms of infection. Call the office immediately if any occur or go directly to the emergency room. Call with any questions/concerns.  

## 2014-12-09 ENCOUNTER — Encounter: Payer: Self-pay | Admitting: Podiatry

## 2014-12-09 DIAGNOSIS — L97509 Non-pressure chronic ulcer of other part of unspecified foot with unspecified severity: Secondary | ICD-10-CM | POA: Insufficient documentation

## 2014-12-09 NOTE — Progress Notes (Signed)
Patient ID: Michael Cantu, male   DOB: [birth year], 59 y.o.   MRN: [id number]  Subjective: 59 year old male presents the office today for follow-up evaluation of an ulceration on the right foot. He has continued daily dressing changes.  He denies any redness or drainage on the wound. He has states his nails are painful elongated and asking if they can be trimmed today as he is unable to do them himself. He denies any systemic complaints as fevers, chills, nausea, vomiting. Denies calf pain, chest pain, soreness of breath. No other complaints at this time.  Objective:  AAO 3, NAD DP/PT pulses decreased bilaterally, CRT less than 3 seconds Protective sensation decreased with Simms Weinstein monofilament Right foot submetatarsal 3 granular ulceration with hyperkeratotic periwound. After debridement and wound measures 0.5 x 0.4 cm and is superficial. The wound base is granular. There is no surrounding erythema, ascending cellulitis, fluctuance, crepitus, malodor. There is no drainage or purulence. There is chronic subluxation of the MPJs. No other open lesions or pre-ulcer lesions identified bilaterally. Nails are hypertrophic, dystrophic, discolored, brittle, elongated 10. There is tenderness palpation along nails 1-5 bilaterally. There is no swelling erythema or drainage. No pain with calf compression, swelling, warmth, erythema.  Assessment: 59 year old male right submetatarsal 3 ulceration, noninfected; symptomatic onychomycosis  Plan: -Treatment options discussed including all alternatives, risks, and complications -Wound sharply debrided without consultation to healthy, bleeding, granular wound base. Iodosorb was applied followed by dry sterile dressing. Continue daily dressing changes at home with iodosorb. -Continue with offloading pads -Nail sharply debrided 10 without complication/bleeding. -Monitor for any clinical signs or symptoms of infection and directed to call the office  immediately should any occur or go to the ER. -Follow-up 2 weeks or sooner if any problems arise. In the meantime, encouraged to call the office with any questions, concerns, change in symptoms.   Celesta Gentile, DPM

## 2014-12-24 ENCOUNTER — Encounter: Payer: Self-pay | Admitting: Podiatry

## 2014-12-24 ENCOUNTER — Ambulatory Visit (INDEPENDENT_AMBULATORY_CARE_PROVIDER_SITE_OTHER): Payer: Medicare Other | Admitting: Podiatry

## 2014-12-24 VITALS — BP 130/67 | HR 96 | Resp 18

## 2014-12-24 DIAGNOSIS — L97511 Non-pressure chronic ulcer of other part of right foot limited to breakdown of skin: Secondary | ICD-10-CM

## 2014-12-24 DIAGNOSIS — L03119 Cellulitis of unspecified part of limb: Secondary | ICD-10-CM

## 2014-12-24 DIAGNOSIS — L02619 Cutaneous abscess of unspecified foot: Secondary | ICD-10-CM | POA: Diagnosis not present

## 2014-12-24 MED ORDER — CEPHALEXIN 500 MG PO CAPS: 500.0000 mg | ORAL_CAPSULE | Freq: Three times a day (TID) | ORAL | Status: DC

## 2014-12-24 NOTE — Patient Instructions (Signed)
Monitor for any clinical signs or symptoms of infection and directed to call the office immediately should any occur or go to the ER.  

## 2014-12-31 ENCOUNTER — Encounter: Payer: Self-pay | Admitting: Podiatry

## 2014-12-31 NOTE — Progress Notes (Signed)
Patient ID: Michael Cantu, male   DOB: 1955/05/21, 59 y.o.   MRN: [id number]  Subjective: 59 year old male presents the office for follow up evaluation of right foot submetatarsal 3 ulceration. He states that he is certainly has not noticed much drainage. She denies any swelling redness or red streaks. Denies any pain. Denies any swelling to his foot. Denies any systemic complaints as fevers, chills, nausea, vomiting. No Pain, chest pain, surface of breath. He has continued to change the dressing daily. No other complaints this time.  Objective: AAO 3, NAD DP/PT pulses decrease, CRT less than 3 seconds  Protective sensation decreased with Derrel Nip monofilament Right foot some metatarsal 3 is an annular ulceration with hyperkeratotic periwound. Just proximal to this area appears to have a small amount of epidermal lysis. Upon expression of this area is a small amount of purulence expressed to the wound. This area was cultured. The wound was debrided of hyperkeratotic tissue and epidermolysis was also debrided. After debridement the wound now measures approximate 2 x 0.5 cm. Along the area of the prior ulceration has a maximal depth of 0.27 m however more proximal and one area of epidermolysis the area superficial with a granular wound base. There is no surrounding erythema, edema, ascending cellulitis, fluctuation, crepitus, malodor. After debridement no further purulence is expressed. No other open lesions or pre-ulcerative lesions. No pain with calf compression, sling, warmth, erythema.  Assessment: 59 year old male right foot ulceration with localized infection  Plan: -Treatment options discussed including all alternatives, risks, and complications -Wound sharply debrided without complications. Small amount of purulence was expressed and this area was cultured. Start Keflex. Monitor for any signs or symptoms of or sooner infection and directed the call the office immediate issue any  occur or go to the ER. -Continue with offloading pad. -Continue with daily dressing changes. -Follow-up as scheduled or sooner if any problems arise. In the meantime, encouraged to call the office with any questions, concerns, change in symptoms.   Celesta Gentile, DPM

## 2015-01-07 ENCOUNTER — Ambulatory Visit (INDEPENDENT_AMBULATORY_CARE_PROVIDER_SITE_OTHER): Payer: Medicare Other | Admitting: Podiatry

## 2015-01-07 ENCOUNTER — Encounter: Payer: Self-pay | Admitting: Podiatry

## 2015-01-07 VITALS — BP 121/95 | HR 101 | Resp 18

## 2015-01-07 DIAGNOSIS — L97511 Non-pressure chronic ulcer of other part of right foot limited to breakdown of skin: Secondary | ICD-10-CM | POA: Diagnosis not present

## 2015-01-07 DIAGNOSIS — E1140 Type 2 diabetes mellitus with diabetic neuropathy, unspecified: Secondary | ICD-10-CM | POA: Diagnosis not present

## 2015-01-07 DIAGNOSIS — E1149 Type 2 diabetes mellitus with other diabetic neurological complication: Secondary | ICD-10-CM

## 2015-01-07 NOTE — Patient Instructions (Signed)
Monitor for any clinical signs or symptoms of infection and directed to call the office immediately should any occur or go to the ER.  Continue daily dressing changes. Please call me with any questions or concerns.  Have a good weekend!

## 2015-01-07 NOTE — Progress Notes (Signed)
Patient ID: Michael Cantu, male   DOB: [birth year], 59 y.o.   MRN: [id number]  Subjective: 59 year old male presents the office for follow up evaluation of right foot submetatarsal 3 ulceration with infection. States he has completed his course of antibiotics. He has not noticed any drainage, redness or any red streaks in the area. He also states he has noticed a small abrasion to the outside part of his right fifth toe as he scraped it on a dresser. Denies any redness or drainage from this area. Denies any systemic complaints as fevers, chills, nausea, vomiting. No pain with calf compression, chest pain, shortness of breath. He has continued to change the dressing daily. No other complaints this time.  Objective: AAO 3, NAD DP/PT pulses decrease, CRT less than 3 seconds  Protective sensation decreased with Simms Weinstein monofilament Right foot submetarsal 3 is an annular ulceration with hyperkeratotic periwound. Distal to this wound along the area at the epidermal lysis, superficial wound the area appears to be healed over with the callus. Upon debridement of or distal portion of the wound appears to be healed and the wound today measures 0.5 x 0.4 cm. The wound is superficial does not probe to bone. There is no undermining or tunneling. There is no swelling erythema, ascending cellulitis, fluctuance, crepitus, malodor. On the lateral aspect of the fifth metatarsal head there is a small superficial abrasion without any associated erythema, increase in warmth. No clinical signs of infection. No other open lesions or pre-ulcerative lesions. No pain with calf compression, swelling, warmth, erythema.  Assessment: 59 year old male right foot ulceration with resolving infection  Plan: -Treatment options discussed including all alternatives, risks, and complications -Wound sharply debrided without complications. Recommended he continue with Silvadene dressing changes daily. -We'll hold off any  further antibiotics of the infection appears to be resolved. -Continue with offloading pad. -Monitor for any clinical signs or symptoms of infection and directed to call the office immediately should any occur or go to the ER. -Follow-up as scheduled or sooner if any problems arise. In the meantime, encouraged to call the office with any questions, concerns, change in symptoms.   Celesta Gentile, DPM

## 2015-01-10 ENCOUNTER — Encounter: Payer: Self-pay | Admitting: Podiatry

## 2015-01-10 DIAGNOSIS — E1149 Type 2 diabetes mellitus with other diabetic neurological complication: Secondary | ICD-10-CM | POA: Insufficient documentation

## 2015-01-28 ENCOUNTER — Encounter: Payer: Self-pay | Admitting: Podiatry

## 2015-01-28 ENCOUNTER — Ambulatory Visit (INDEPENDENT_AMBULATORY_CARE_PROVIDER_SITE_OTHER): Payer: Medicare Other | Admitting: Podiatry

## 2015-01-28 VITALS — BP 132/66 | HR 86 | Resp 18

## 2015-01-28 DIAGNOSIS — L97511 Non-pressure chronic ulcer of other part of right foot limited to breakdown of skin: Secondary | ICD-10-CM | POA: Diagnosis not present

## 2015-01-28 NOTE — Progress Notes (Signed)
Patient ID: BUEFORD ARP, male   DOB: [birth year], 59 y.o.   MRN: [id number]  Subjective: 59 year old male presents the office for follow up evaluation of right foot submetatarsal 3 ulceration with infection. He states the wound has decreased in size. His wife is noted some small minor bloody drainage from the area but denies any pus. Denies any surrounding redness or red streaks. Denies any systemic complaints as fevers, chills, nausea, vomiting. No pain with calf compression, chest pain, shortness of breath. He has continued to change the dressing daily. No other complaints this time.  Objective: AAO 3, NAD DP/PT pulses decrease, CRT less than 3 seconds  Protective sensation decreased with Simms Weinstein monofilament Right foot submetarsal 3 is an annular ulceration with hyperkeratotic periwound. Upon debridement to the the wound measures 0.4 x 0.2 cm and is superficial. There is no probing, undermining, tunneling. There is no surrounding erythema, ascending cellulitis, fluctuance, crepitus, malodor. There are no other open lesions or pre-ulcer lesions identified at this time. There is no pain with calf compression, swelling, warmth, erythema.  Assessment: 59 year old male right foot ulceration with no signs of infection at this time.  Plan: -Treatment options discussed including all alternatives, risks, and complications -Wound sharply debrided without complications. Recommended he continue with Silvadene dressing changes daily. -Continue with offloading pad. -Monitor for any clinical signs or symptoms of infection and directed to call the office immediately should any occur or go to the ER. -Follow-up as scheduled or sooner if any problems arise. In the meantime, encouraged to call the office with any questions, concerns, change in symptoms.   Celesta Gentile, DPM

## 2015-02-18 ENCOUNTER — Ambulatory Visit: Payer: Medicare Other | Admitting: Podiatry

## 2015-03-07 ENCOUNTER — Encounter: Payer: Self-pay | Admitting: Podiatry

## 2015-03-07 ENCOUNTER — Ambulatory Visit (INDEPENDENT_AMBULATORY_CARE_PROVIDER_SITE_OTHER): Payer: Medicare Other | Admitting: Podiatry

## 2015-03-07 VITALS — BP 108/63 | HR 94 | Resp 18

## 2015-03-07 DIAGNOSIS — L84 Corns and callosities: Secondary | ICD-10-CM | POA: Diagnosis not present

## 2015-03-07 DIAGNOSIS — E1149 Type 2 diabetes mellitus with other diabetic neurological complication: Secondary | ICD-10-CM

## 2015-03-07 DIAGNOSIS — L97511 Non-pressure chronic ulcer of other part of right foot limited to breakdown of skin: Secondary | ICD-10-CM

## 2015-03-07 MED ORDER — CEPHALEXIN 500 MG PO CAPS: 500.0000 mg | ORAL_CAPSULE | Freq: Three times a day (TID) | ORAL | Status: AC

## 2015-03-08 ENCOUNTER — Encounter: Payer: Self-pay | Admitting: Podiatry

## 2015-03-08 NOTE — Progress Notes (Signed)
Patient ID: Michael Cantu, male   DOB: 1955/08/14, 59 y.o.   MRN: [id number]  Subjective: 59 year old male presents the office for follow up evaluation of right foot submetatarsal 3 ulceration. The patient's wife states that she has continue with Silvadene dressing changes that she believes that at times she may be doing too much and causing the skin to get too moist. He has developed a blister just adjacent to the wound today. He noticed some clear drainage from November denies any palms. Denies any swelling red streaks or redness. He denies any malodor. 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 Denies any redness or drainage. Denies any systemic complaints as fevers, chills, nausea, vomiting. No pain with calf compression, chest pain, shortness of breath. He has continued to change the dressing daily. No other complaints this time.  Objective: AAO 3, NAD DP/PT pulses decrease, CRT less than 3 seconds  Protective sensation decreased with Simms Weinstein monofilament Right foot submetarsal 3 is an annular ulceration with hyperkeratotic periwound. There is a bulla just proximal to the ulceration. Upon debridement the wound appears to be increase in size today. The wound today measures 1.3 x 0.6 cm and he is with a granular wound base. There is no probing, undermining, tunneling the wound appears to be superficial. There is no surrounding erythema, ascending cellulitis, fluctuance, crepitus, malodor, purulence. There is a small serous drainage expressed. There is hyperkeratotic tissue along the indications of the right foot as well as the lateral aspect of the left foot. Upon debridement no underlying ulceration, drainage or other signs of infection. No other open lesions or pre-ulcerative lesions are identified at this time. There is no pain with calf compression, swelling,  warmth, erythema.  Assessment: 59 year old male right foot ulceration; hyperkeratotic lesions  Plan: -Treatment options discussed including all alternatives, risks, and complications -Wound sharply debrided without complications. Recommended he continue with Silvadene dressing changes daily however to only use a small amount and not apply to the skin. If he noticed the area around skin gets moist a hold off a dry dressing. Hyperkeratotic lesion sharply debrided 2 without couple complications/bleeding.- -Continue with offloading pad. -Monitor for any clinical signs or symptoms of infection and directed to call the office immediately should any occur or go to the ER. -Follow-up as scheduled or sooner if any problems arise. In the meantime, encouraged to call the office with any questions, concerns, change in symptoms.   Celesta Gentile, DPM

## 2015-03-21 ENCOUNTER — Ambulatory Visit (INDEPENDENT_AMBULATORY_CARE_PROVIDER_SITE_OTHER): Payer: Medicare Other | Admitting: Podiatry

## 2015-03-21 ENCOUNTER — Encounter: Payer: Self-pay | Admitting: Podiatry

## 2015-03-21 VITALS — BP 112/75 | HR 91 | Resp 18

## 2015-03-21 DIAGNOSIS — L89891 Pressure ulcer of other site, stage 1: Secondary | ICD-10-CM | POA: Diagnosis not present

## 2015-03-21 DIAGNOSIS — E1149 Type 2 diabetes mellitus with other diabetic neurological complication: Secondary | ICD-10-CM

## 2015-03-21 DIAGNOSIS — L97511 Non-pressure chronic ulcer of other part of right foot limited to breakdown of skin: Secondary | ICD-10-CM

## 2015-03-22 ENCOUNTER — Encounter: Payer: Self-pay | Admitting: Podiatry

## 2015-03-22 NOTE — Progress Notes (Signed)
Patient ID: Michael Cantu, male   DOB: 08-22-1955, 59 y.o.   MRN: [id number]  Subjective: 59 year old male presents the office for follow up evaluation of right foot submetatarsal 3 ulceration.  He continue with Silvadene dressing changes. Denies any drainage or pus. Denies any redness or drainage. Denies any systemic complaints as fevers, chills, nausea, vomiting. No pain with calf compression, chest pain, shortness of breath. He has continued to change the dressing daily. No other complaints this time.  Objective: AAO 3, NAD DP/PT pulses decrease, CRT less than 3 seconds  Protective sensation decreased with Simms Weinstein monofilament Right foot submetarsal 3 is an annular ulceration with hyperkeratotic periwound.  After debridement to the wound measures 0.6 x 0.5 cm in a superficial granular wound base. The area of the previous bulla proximal to the area had some epidermal lysis however after debridement no underlying ulceration, drainage or other signs of infection. There is no surrounding erythema, ascending cellulitis, fluctuance, crepitus, malodor. No drainage or pus. No other open lesions or pre-ulcerative lesions. There is no pain with calf compression, swelling, warmth, erythema.  Assessment: 59 year old male right foot ulceration, healing.   Plan: -Treatment options discussed including all alternatives, risks, and complications -Wound sharply debrided without complications. Recommended he continue with Silvadene dressing changes daily however to only use a small amount and not apply to the skin. If he noticed the area around skin gets moist a hold off a dry dressing. -Continue with offloading pad. -Monitor for any clinical signs or symptoms of infection and directed to call the office immediately should any occur or go to the ER. -Follow-up as scheduled or sooner if any problems arise. In the meantime, encouraged to call the office with any questions, concerns, change in symptoms.    Celesta Gentile, DPM

## 2015-04-07 DIAGNOSIS — L409 Psoriasis, unspecified: Secondary | ICD-10-CM | POA: Diagnosis not present

## 2015-04-07 DIAGNOSIS — J45901 Unspecified asthma with (acute) exacerbation: Secondary | ICD-10-CM | POA: Diagnosis not present

## 2015-04-07 DIAGNOSIS — I10 Essential (primary) hypertension: Secondary | ICD-10-CM | POA: Diagnosis not present

## 2015-04-07 DIAGNOSIS — E1165 Type 2 diabetes mellitus with hyperglycemia: Secondary | ICD-10-CM | POA: Diagnosis not present

## 2015-04-12 ENCOUNTER — Encounter: Payer: Self-pay | Admitting: Podiatry

## 2015-04-12 ENCOUNTER — Ambulatory Visit (INDEPENDENT_AMBULATORY_CARE_PROVIDER_SITE_OTHER): Payer: Medicare Other | Admitting: Podiatry

## 2015-04-12 VITALS — BP 153/83 | HR 91 | Resp 18

## 2015-04-12 DIAGNOSIS — L89891 Pressure ulcer of other site, stage 1: Secondary | ICD-10-CM

## 2015-04-12 DIAGNOSIS — L97511 Non-pressure chronic ulcer of other part of right foot limited to breakdown of skin: Secondary | ICD-10-CM

## 2015-04-13 ENCOUNTER — Encounter: Payer: Self-pay | Admitting: Podiatry

## 2015-04-13 DIAGNOSIS — E1165 Type 2 diabetes mellitus with hyperglycemia: Secondary | ICD-10-CM | POA: Diagnosis not present

## 2015-04-13 DIAGNOSIS — L409 Psoriasis, unspecified: Secondary | ICD-10-CM | POA: Diagnosis not present

## 2015-04-13 DIAGNOSIS — I10 Essential (primary) hypertension: Secondary | ICD-10-CM | POA: Diagnosis not present

## 2015-04-13 DIAGNOSIS — L97519 Non-pressure chronic ulcer of other part of right foot with unspecified severity: Secondary | ICD-10-CM | POA: Insufficient documentation

## 2015-04-13 NOTE — Progress Notes (Signed)
Patient ID: Michael Cantu, male   DOB: 1955/05/30, 59 y.o.   MRN: [id number]  Subjective: 58 year old male presents the office for follow up evaluation of right foot submetatarsal 3 ulceration.  He continue with Silvadene dressing changes. Denies any pus but does get some bloody drainage at times.  Denies any systemic complaints as fevers, chills, nausea, vomiting. No pain with calf compression, chest pain, shortness of breath. He has continued to change the dressing daily. No other complaints this time.  Objective: AAO 3, NAD DP/PT pulses decrease, CRT less than 3 seconds  Protective sensation decreased with Simms Weinstein monofilament Right foot submetarsal 3 is an annular ulceration with hyperkeratotic periwound.  After debridement to the wound measures 0.6 x 0.5 x 0.1 cm in a superficial granular wound base. The wound does appear to be somewhat more superifical and clean today.  No surrounding bulla. There is no surrounding erythema, ascending cellulitis, fluctuance, crepitus, malodor. No drainage or pus. No other open lesions or pre-ulcerative lesions. There is no pain with calf compression, swelling, warmth, erythema.  Assessment: 59 year old male right foot ulceration  Plan: -Treatment options discussed including all alternatives, risks, and complications -Wound sharply debrided without complications. Recommended he continue with Silvadene dressing changes daily however to only use a small amount and not apply to the skin. If he noticed the area around skin gets moist a hold off a dry dressing. -Continue with offloading pad. -Monitor for any clinical signs or symptoms of infection and directed to call the office immediately should any occur or go to the ER. -If not healing, possible referral to wound care.  -Follow-up as scheduled or sooner if any problems arise. In the meantime, encouraged to call the office with any questions, concerns, change in symptoms.   Celesta Gentile,  DPM

## 2015-04-19 ENCOUNTER — Ambulatory Visit (INDEPENDENT_AMBULATORY_CARE_PROVIDER_SITE_OTHER): Payer: Medicare Other | Admitting: Podiatry

## 2015-04-19 ENCOUNTER — Encounter: Payer: Self-pay | Admitting: Podiatry

## 2015-04-19 VITALS — BP 129/77 | HR 79 | Resp 14

## 2015-04-19 DIAGNOSIS — L89891 Pressure ulcer of other site, stage 1: Secondary | ICD-10-CM | POA: Diagnosis not present

## 2015-04-19 DIAGNOSIS — L97511 Non-pressure chronic ulcer of other part of right foot limited to breakdown of skin: Secondary | ICD-10-CM

## 2015-04-19 NOTE — Progress Notes (Signed)
Subjective:     Patient ID: Michael Cantu, male   DOB: 09/02/1955, 59 y.o.   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  He says there was bloody drainage from the ulcer and he became concerned about the healing of the ulcer.  He was sen by Dr. Jacqualyn Posey last week and rescheduled three weeks.   Review of Systems     Objective:   Physical Exam GENERAL APPEARANCE: Alert, conversant. Appropriately groomed. No acute distress.  VASCULAR: Pedal pulses are minimally  palpable at  [facility] and PT bilateral.  Capillary refill time is immediate to all digits,  Normal temperature gradient.    NEUROLOGIC: sensation is diminished to 5.07 monofilament at 5/5 sites bilateral.  Light touch is intact bilateral, Muscle strength normal.  MUSCULOSKELETAL: acceptable muscle strength, tone and stability bilateral.  Intrinsic muscluature intact bilateral.  Rectus appearance of foot and digits noted bilateral.   DERMATOLOGIC: skin color, texture, and turgor are within normal limits.  No preulcerative lesions or ulcers  are seen, no interdigital maceration noted.  No open lesions present.  Digital nails are asymptomatic. No drainage noted. Ulcer sub 3 metatarsal right foot with no drainage or malodor.Ulcer is 0.5 mm. X 0.7 mm with no infection.        Assessment:     Diabetic Ulcer right foot     Plan:     Debridement of necrotic tissue.  Reassured patient his healing is progressing well.  RTC 3 weeks.   Gardiner Barefoot DPM

## 2015-05-03 ENCOUNTER — Ambulatory Visit (INDEPENDENT_AMBULATORY_CARE_PROVIDER_SITE_OTHER): Payer: Medicare Other | Admitting: Podiatry

## 2015-05-03 ENCOUNTER — Encounter: Payer: Self-pay | Admitting: Podiatry

## 2015-05-03 VITALS — BP 145/78 | HR 91 | Resp 18

## 2015-05-03 DIAGNOSIS — E1149 Type 2 diabetes mellitus with other diabetic neurological complication: Secondary | ICD-10-CM

## 2015-05-03 DIAGNOSIS — L89891 Pressure ulcer of other site, stage 1: Secondary | ICD-10-CM | POA: Diagnosis not present

## 2015-05-03 DIAGNOSIS — L84 Corns and callosities: Secondary | ICD-10-CM

## 2015-05-03 DIAGNOSIS — L97511 Non-pressure chronic ulcer of other part of right foot limited to breakdown of skin: Secondary | ICD-10-CM

## 2015-05-03 NOTE — Progress Notes (Signed)
Patient ID: Michael Cantu, male   DOB: 03-27-1956, 60 y.o.   MRN: [id number]  Subjective: 60 year old male presents the office for follow up evaluation of right foot submetatarsal 3 ulceration. The office possibly Related Folmar some bleeding from the wound has significantly improved. He feels that overall the wounds improving. Denies any redness or red streaks or any pus or malodor. Denies any systemic complaints as fevers, chills, nausea, vomiting. No pain with calf compression, chest pain, shortness of breath. He has continued to change the dressing daily. No other complaints this time.  Objective: AAO 3, NAD DP/PT pulses decrease, CRT less than 3 seconds  Protective sensation decreased with Simms Weinstein monofilament Right foot submetarsal 3 is an annular ulceration with hyperkeratotic periwound.  After debridement to the wound measures 0.5 x 0.4 cm and appears to be superficial.  The wound is very small. Last appointment. There is no surrounding erythema, ascending cellulitis, fluctuance, crepitus, malodor, drainage or purulence. There is hyperkeratotic lesion along the previous partial first ray rotation of the right foot. Upon debridement underlying ulceration, drainage or other signs of infection. There is no pain with calf compression, swelling, warmth, erythema.  Assessment: 60 year old male right foot ulcerationWith evidence of healing   Plan: -Treatment options discussed including all alternatives, risks, and complications -Wound sharply debrided without complications. Recommended he continue with Silvadene dressing changes daily however to only use a small amount and not apply to the skin. -Continue with offloading pad. He does wear his diabetic inserts and shoes. I did modify his inserts today to help take more pressure off of this area.  -Hyperkeratotic lesion debrided on the previous partial first ray amputation without complications or bleeding.  -Monitor for any clinical  signs or symptoms of infection and directed to call the office immediately should any occur or go to the ER. -Follow-up as scheduled or sooner if any problems arise. In the meantime, encouraged to call the office with any questions, concerns, change in symptoms.   Celesta Gentile, DPM

## 2015-05-11 DIAGNOSIS — R1031 Right lower quadrant pain: Secondary | ICD-10-CM | POA: Diagnosis not present

## 2015-05-11 DIAGNOSIS — N201 Calculus of ureter: Secondary | ICD-10-CM | POA: Diagnosis not present

## 2015-05-11 DIAGNOSIS — I2510 Atherosclerotic heart disease of native coronary artery without angina pectoris: Secondary | ICD-10-CM | POA: Diagnosis not present

## 2015-05-11 DIAGNOSIS — N132 Hydronephrosis with renal and ureteral calculous obstruction: Secondary | ICD-10-CM | POA: Diagnosis not present

## 2015-05-11 DIAGNOSIS — E1165 Type 2 diabetes mellitus with hyperglycemia: Secondary | ICD-10-CM | POA: Diagnosis not present

## 2015-05-11 DIAGNOSIS — R1110 Vomiting, unspecified: Secondary | ICD-10-CM | POA: Diagnosis not present

## 2015-05-18 DIAGNOSIS — E1165 Type 2 diabetes mellitus with hyperglycemia: Secondary | ICD-10-CM | POA: Diagnosis not present

## 2015-05-18 DIAGNOSIS — I10 Essential (primary) hypertension: Secondary | ICD-10-CM | POA: Diagnosis not present

## 2015-05-24 ENCOUNTER — Encounter: Payer: Self-pay | Admitting: Podiatry

## 2015-05-24 ENCOUNTER — Ambulatory Visit (INDEPENDENT_AMBULATORY_CARE_PROVIDER_SITE_OTHER): Payer: Medicare Other | Admitting: Podiatry

## 2015-05-24 DIAGNOSIS — E1149 Type 2 diabetes mellitus with other diabetic neurological complication: Secondary | ICD-10-CM

## 2015-05-24 DIAGNOSIS — L97511 Non-pressure chronic ulcer of other part of right foot limited to breakdown of skin: Secondary | ICD-10-CM

## 2015-05-24 DIAGNOSIS — L89891 Pressure ulcer of other site, stage 1: Secondary | ICD-10-CM | POA: Diagnosis not present

## 2015-05-26 NOTE — Progress Notes (Signed)
Patient ID: Michael Cantu, male   DOB: [birth year], 60 y.o.   MRN: [id number]  Subjective: 60 year old male presents the office for follow up evaluation of right foot submetatarsal 3 ulceration. He states he still gets some clear drainage from the wound however does appear to be decreasing. Denies any pus. Denies any redness or red streaks or any pus or malodor. Denies any systemic complaints as fevers, chills, nausea, vomiting. No pain with calf compression, chest pain, shortness of breath. He has continued to change the dressing daily. No other complaints this time.  Objective: AAO 3, NAD DP/PT pulses decrease, CRT less than 3 seconds  Protective sensation decreased with Simms Weinstein monofilament Right foot submetarsal 3 is an annular ulceration with hyperkeratotic periwound.  After debridement to the wound measures 0.6 x 0.5 cm and appears to be superficial, however slightly larger diameter compared to last appointment. There is no surrounding erythema, ascending cellulitis, fluctuance, crepitus, malodor, drainage or purulence. There is hyperkeratotic lesion along the previous partial first ray rotation of the right foot. Upon debridement underlying ulceration, drainage or other signs of infection. There is no pain with calf compression, swelling, warmth, erythema.  Assessment: 60 year old male right foot ulceration, no clinical signs of infection.   Plan: -Treatment options discussed including all alternatives, risks, and complications -Wound sharply debrided without complications. Recommended he continue with Silvadene dressing changes daily however to only use a small amount and not apply to the skin. -Continue with offloading pad. Recommend surgical shoe with offloading pad. He has a surgical shoe at home and will bring it next appointment for modification. -Monitor for any clinical signs or symptoms of infection and directed to call the office immediately should any occur or go to  the ER. -Follow-up as scheduled or sooner if any problems arise. In the meantime, encouraged to call the office with any questions, concerns, change in symptoms.   Celesta Gentile, DPM

## 2015-06-07 ENCOUNTER — Encounter: Payer: Self-pay | Admitting: Podiatry

## 2015-06-07 ENCOUNTER — Ambulatory Visit (INDEPENDENT_AMBULATORY_CARE_PROVIDER_SITE_OTHER): Payer: Medicare Other | Admitting: Podiatry

## 2015-06-07 VITALS — BP 116/67 | HR 102 | Resp 18

## 2015-06-07 DIAGNOSIS — E1149 Type 2 diabetes mellitus with other diabetic neurological complication: Secondary | ICD-10-CM | POA: Diagnosis not present

## 2015-06-07 DIAGNOSIS — L84 Corns and callosities: Secondary | ICD-10-CM | POA: Diagnosis not present

## 2015-06-07 DIAGNOSIS — L89891 Pressure ulcer of other site, stage 1: Secondary | ICD-10-CM | POA: Diagnosis not present

## 2015-06-07 DIAGNOSIS — L97511 Non-pressure chronic ulcer of other part of right foot limited to breakdown of skin: Secondary | ICD-10-CM

## 2015-06-09 ENCOUNTER — Encounter: Payer: Self-pay | Admitting: Podiatry

## 2015-06-09 NOTE — Progress Notes (Signed)
Patient ID: Michael Cantu, male   DOB: 1956/03/28, 60 y.o.   MRN: [id number]  Subjective: 60 year old male presents the office for follow up evaluation of right foot submetatarsal 3 ulceration.  He states that  He still gets some occasional drainage but he does not recall the color as his wife changes the bandage daily. Denies any redness or red streaks or any malodor. Denies any systemic complaints as fevers, chills, nausea, vomiting. No pain with calf compression, chest pain, shortness of breath. He has continued to change the dressing daily. No other complaints this time.  Objective: AAO 3, NAD DP/PT pulses decrease, CRT less than 3 seconds  Protective sensation decreased with Simms Weinstein monofilament Right foot submetarsal 3 is an annular ulceration with hyperkeratotic periwound.  After debridement to the wound measures 0.5 x 0.5 cm and appears to be superficial. There is no surrounding erythema, ascending cellulitis, fluctuance, crepitus, malodor, drainage or purulence. There is hyperkeratotic lesion along the previous partial first ray rotation of the right foot. Upon debridement underlying ulceration, drainage or other signs of infection. There is no pain with calf compression, swelling, warmth, erythema.  Assessment: 60 year old male right foot ulceration, no clinical signs of infection; callus  Plan: -Treatment options discussed including all alternatives, risks, and complications -Wound sharply debrided without complications. Recommended he continue with Silvadene dressing changes daily however to only use a small amount and not apply to the skin. -Continue with offloading pad. Recommend surgical shoe with offloading pad.  He did bring in multiple answers today which are modified to help take pressure off the area. -Callus debrided without complications or bleeding. -Monitor for any clinical signs or symptoms of infection and directed to call the office immediately should any  occur or go to the ER. -Follow-up as scheduled or sooner if any problems arise. In the meantime, encouraged to call the office with any questions, concerns, change in symptoms.   Celesta Gentile, DPM

## 2015-06-21 ENCOUNTER — Ambulatory Visit (INDEPENDENT_AMBULATORY_CARE_PROVIDER_SITE_OTHER): Payer: Medicare Other | Admitting: Podiatry

## 2015-06-21 ENCOUNTER — Encounter: Payer: Self-pay | Admitting: Podiatry

## 2015-06-21 VITALS — BP 113/62 | HR 114 | Resp 18

## 2015-06-21 DIAGNOSIS — L89891 Pressure ulcer of other site, stage 1: Secondary | ICD-10-CM | POA: Diagnosis not present

## 2015-06-21 DIAGNOSIS — L97511 Non-pressure chronic ulcer of other part of right foot limited to breakdown of skin: Secondary | ICD-10-CM

## 2015-06-21 DIAGNOSIS — E1149 Type 2 diabetes mellitus with other diabetic neurological complication: Secondary | ICD-10-CM

## 2015-06-21 MED ORDER — SILVER SULFADIAZINE 1 % EX CREA: 1.0000 "application " | TOPICAL_CREAM | Freq: Every day | CUTANEOUS | Status: AC

## 2015-06-21 NOTE — Progress Notes (Signed)
Patient ID: Michael Cantu, male   DOB: 17-Jul-1955, 60 y.o.   MRN: [id number]  Subjective: 60 year old male presents the office for follow up evaluation of right foot submetatarsal 3 ulceration. The drainage has decreased. Denies any pus or surrounding erythema or red streaks. He has been applying sliver gel to the wound daily. Denies any systemic complaints as fevers, chills, nausea, vomiting. No pain with calf compression, chest pain, shortness of breath. He has continued to change the dressing daily. No other complaints this time.  Objective: AAO 3, NAD DP/PT pulses decrease, CRT less than 3 seconds  Protective sensation decreased with Simms Weinstein monofilament Right foot submetarsal 3 is an annular ulceration with hyperkeratotic periwound.  After debridement to the wound measures 0.6 x 0.4 cm and appears to be superficial and was granular after debridement. There is no surrounding erythema, ascending cellulitis, fluctuance, crepitus, malodor, drainage or purulence. There is no pain with calf compression, swelling, warmth, erythema.  Assessment: 60 year old male right foot ulceration, no clinical signs of infection  Plan: -Treatment options discussed including all alternatives, risks, and complications -Wound sharply debrided without complications. The wound was somewhat protruding though the wound. Silver nite applied today. Recommended he continue with Silvadene dressing changes daily however to only use a small amount and not apply to the skin. Silvadene prescribed today. Continue offloading pads.  -Monitor for any clinical signs or symptoms of infection and directed to call the office immediately should any occur or go to the ER. -Follow-up as scheduled or sooner if any problems arise. In the meantime, encouraged to call the office with any questions, concerns, change in symptoms.   Michael Cantu, DPM

## 2015-07-08 ENCOUNTER — Ambulatory Visit (INDEPENDENT_AMBULATORY_CARE_PROVIDER_SITE_OTHER): Payer: Medicare Other | Admitting: Podiatry

## 2015-07-08 ENCOUNTER — Encounter: Payer: Self-pay | Admitting: Podiatry

## 2015-07-08 VITALS — BP 123/56 | HR 97 | Resp 18

## 2015-07-08 DIAGNOSIS — L97511 Non-pressure chronic ulcer of other part of right foot limited to breakdown of skin: Secondary | ICD-10-CM

## 2015-07-08 DIAGNOSIS — L89891 Pressure ulcer of other site, stage 1: Secondary | ICD-10-CM

## 2015-07-08 DIAGNOSIS — E1149 Type 2 diabetes mellitus with other diabetic neurological complication: Secondary | ICD-10-CM

## 2015-07-09 ENCOUNTER — Encounter: Payer: Self-pay | Admitting: Podiatry

## 2015-07-09 NOTE — Progress Notes (Signed)
Patient ID: Michael Cantu, male   DOB: 1956-01-22, 60 y.o.   MRN: [id number]  Subjective: 60 year old male presents the office for follow up evaluation of right foot submetatarsal 3 ulceration. He feels that overall the wound is getting better. His drainage has decreased. Denies any strenuous redness or red streaks. No pus. He can use apply silver gel to the wound. Denies any systemic complaints as fevers, chills, nausea, vomiting. No pain with calf compression, chest pain, shortness of breath. He has continued to change the dressing daily. No other complaints this time.  Objective: AAO 3, NAD DP/PT pulses decrease, CRT less than 3 seconds  Protective sensation decreased with Simms Weinstein monofilament Right foot submetarsal 3 is an annular ulceration with hyperkeratotic periwound.  After debridement to the wound measures 0.5 x 0.4 cm and appears to be superficial and was granular after debridement. There is no surrounding erythema, ascending cellulitis, fluctuance, crepitus, malodor, drainage or purulence. There is no pain with calf compression, swelling, warmth, erythema.  Assessment: 60 year old male right foot ulceration, no clinical signs of infection  Plan: -Treatment options discussed including all alternatives, risks, and complications -Wound sharply debrided without complications. We will switch to Prisma dressing changes today. Continue daily dressing changes. -Continue offloading  -Monitor for any clinical signs or symptoms of infection and directed to call the office immediately should any occur or go to the ER. -Follow-up as scheduled or sooner if any problems arise. In the meantime, encouraged to call the office with any questions, concerns, change in symptoms.   Celesta Gentile, DPM

## 2015-07-22 ENCOUNTER — Encounter: Payer: Self-pay | Admitting: Podiatry

## 2015-07-22 ENCOUNTER — Ambulatory Visit (INDEPENDENT_AMBULATORY_CARE_PROVIDER_SITE_OTHER): Payer: Medicare Other | Admitting: Podiatry

## 2015-07-22 VITALS — BP 139/70 | HR 64 | Resp 12

## 2015-07-22 DIAGNOSIS — L89891 Pressure ulcer of other site, stage 1: Secondary | ICD-10-CM | POA: Diagnosis not present

## 2015-07-22 DIAGNOSIS — E1149 Type 2 diabetes mellitus with other diabetic neurological complication: Secondary | ICD-10-CM

## 2015-07-22 DIAGNOSIS — L97511 Non-pressure chronic ulcer of other part of right foot limited to breakdown of skin: Secondary | ICD-10-CM

## 2015-07-22 MED ORDER — DESOXIMETASONE 0.25 % EX CREA: 1.0000 "application " | TOPICAL_CREAM | Freq: Two times a day (BID) | CUTANEOUS | Status: AC

## 2015-07-25 ENCOUNTER — Encounter: Payer: Self-pay | Admitting: Podiatry

## 2015-07-25 NOTE — Progress Notes (Signed)
Patient ID: BELAL ALVARDO, male   DOB: 06-02-1955, 60 y.o.   MRN: [id number]   Subjective: 60 year old male presents the office for follow up evaluation of right foot submetatarsal 3 ulceration. He states that the right foot is "looking a little better". He denies any drainage or redness or any red streaks. He has not been wearing the operative pads and continues to regular shoes. He has been continuing with the Prisma dressing change daily. Denies any systemic complaints as fevers, chills, nausea, vomiting. No pain with calf compression, chest pain, shortness of breath.   Objective: AAO 3, NAD DP/PT pulses decrease, CRT less than 3 seconds  Protective sensation decreased with Simms Weinstein monofilament Right foot submetarsal 3 is an annular ulceration with hyperkeratotic periwound.  After debridement to the wound measures 0.4 x 0.4 cm and appears to be superficial and was granular after debridement. There is a prominence the metatarsal head. There is no surrounding erythema, ascending cellulitis, fluctuance, crepitus, malodor, drainage or purulence. There is no pain with calf compression, swelling, warmth, erythema.  Assessment: 60 year old male right foot ulceration, no clinical signs of infection  Plan: -Treatment options discussed including all alternatives, risks, and complications -Wound sharply debrided without complications. We will switch to Prisma dressing changes today. Continue daily dressing changes. -Continue offloading  -Monitor for any clinical signs or symptoms of infection and directed to call the office immediately should any occur or go to the ER. -Follow-up as scheduled or sooner if any problems arise. In the meantime, encouraged to call the office with any questions, concerns, change in symptoms.  *x-ray right foot next appointment  Celesta Gentile, DPM

## 2015-08-08 ENCOUNTER — Ambulatory Visit (INDEPENDENT_AMBULATORY_CARE_PROVIDER_SITE_OTHER): Payer: Medicare Other | Admitting: Podiatry

## 2015-08-08 ENCOUNTER — Encounter: Payer: Self-pay | Admitting: Podiatry

## 2015-08-08 VITALS — BP 129/73 | HR 69 | Resp 12

## 2015-08-08 DIAGNOSIS — R262 Difficulty in walking, not elsewhere classified: Secondary | ICD-10-CM

## 2015-08-08 DIAGNOSIS — L89891 Pressure ulcer of other site, stage 1: Secondary | ICD-10-CM

## 2015-08-08 DIAGNOSIS — E1149 Type 2 diabetes mellitus with other diabetic neurological complication: Secondary | ICD-10-CM

## 2015-08-08 DIAGNOSIS — L97511 Non-pressure chronic ulcer of other part of right foot limited to breakdown of skin: Secondary | ICD-10-CM

## 2015-08-08 DIAGNOSIS — Z89421 Acquired absence of other right toe(s): Secondary | ICD-10-CM

## 2015-08-08 DIAGNOSIS — M14671 Charcot's joint, right ankle and foot: Secondary | ICD-10-CM

## 2015-08-08 DIAGNOSIS — Z89411 Acquired absence of right great toe: Secondary | ICD-10-CM

## 2015-08-08 DIAGNOSIS — M24274 Disorder of ligament, right foot: Secondary | ICD-10-CM

## 2015-08-08 NOTE — Progress Notes (Addendum)
Patient ID: Michael Cantu, male   DOB: 24-Oct-1955, 60 y.o.   MRN: [id number]  Subjective: 60 year old male presents the office for follow up evaluation of right foot submetatarsal 3 ulceration. He states that he does believe that the pain is getting better slowly. There is been more callus overlying the area compared to previous. Denies any redness or drainage or any swelling. Denies any systemic complaints as fevers, chills, nausea, vomiting. No pain with calf compression, chest pain, shortness of breath. No acute changes since last appointment.   Objective: AAO 3, NAD DP/PT pulses decrease, CRT less than 3 seconds  Protective sensation decreased with Simms Weinstein monofilament Right foot submetarsal 3 is an annular ulceration with hyperkeratotic periwound. The hyperkeratotic tissue does appear to be more significant compared to last appointment.  After debridement to the wound measures 0.4 x 0.4 cm and appears to be about the same as last appointment. Previous ampuations of the hallux and second toe on the right foot. There is prominence of the metatarsal head. There is no surrounding erythema, ascending cellulitis, fluctuance, crepitus, malodor, drainage or purulence. There is no pain with calf compression, swelling, warmth, erythema.  Assessment: 60 year old male right foot ulceration, no clinical signs of infection; Charcot  Plan: -Treatment options discussed including all alternatives, risks, and complications -Wound sharply debrided without complications. We will switch to Prisma dressing changes today. Continue daily dressing changes. -Continue offloading-continue him to bring his surgical shoe so we can do a better job of offload this. Also I'll have him follow-up with Betha to see if there is a brace which may help him more. He also has a history of Charcot.  -Monitor for any clinical signs or symptoms of infection and directed to call the office immediately should any occur or go  to the ER. -Follow-up as scheduled or sooner if any problems arise. In the meantime, encouraged to call the office with any questions, concerns, change in symptoms.  *x-ray right foot next appointment  Celesta Gentile, DPM

## 2015-08-22 ENCOUNTER — Encounter: Payer: Self-pay | Admitting: Podiatry

## 2015-08-22 ENCOUNTER — Ambulatory Visit (INDEPENDENT_AMBULATORY_CARE_PROVIDER_SITE_OTHER): Payer: Medicare Other

## 2015-08-22 ENCOUNTER — Ambulatory Visit (INDEPENDENT_AMBULATORY_CARE_PROVIDER_SITE_OTHER): Payer: Medicare Other | Admitting: Podiatry

## 2015-08-22 VITALS — BP 142/72 | HR 83 | Resp 18

## 2015-08-22 DIAGNOSIS — Z899 Acquired absence of limb, unspecified: Secondary | ICD-10-CM | POA: Diagnosis not present

## 2015-08-22 DIAGNOSIS — R52 Pain, unspecified: Secondary | ICD-10-CM

## 2015-08-22 DIAGNOSIS — L97511 Non-pressure chronic ulcer of other part of right foot limited to breakdown of skin: Secondary | ICD-10-CM | POA: Diagnosis not present

## 2015-08-22 DIAGNOSIS — S93104S Unspecified dislocation of right toe(s), sequela: Secondary | ICD-10-CM | POA: Diagnosis not present

## 2015-08-22 NOTE — Progress Notes (Signed)
Patient ID: Michael Cantu, male   DOB: 1955-09-13, 60 y.o.   MRN: [id number]  Subjective: 60 year old male presents the office for follow up evaluation of right foot submetatarsal 3 ulceration. He states the areas at the same. Denies any drainage or pus. No redness or swelling. He has continue with regular shoe. He did bring a surgical shoe for me to evaluate. Denies any systemic complaints as fevers, chills, nausea, vomiting. No pain with calf compression, chest pain, shortness of breath. No acute changes since last appointment.   Objective: AAO 3, NAD DP/PT pulses decrease, CRT less than 3 seconds  Protective sensation decreased with Simms Weinstein monofilament Right foot submetarsal 3 is an annular ulceration with hyperkeratotic periwound. The hyperkeratotic tissue does appear to be more significant compared to last appointment.  After debridement to the wound measures 0.4 x 0.4 cm and appears to be the same since last appointment. 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 There is prominence of the metatarsal head. There is no surrounding erythema, ascending cellulitis, fluctuance, crepitus, malodor, drainage or purulence. There is no pain with calf compression, swelling, warmth, erythema.  Assessment: 60 year old male right foot ulceration, no clinical signs of infection  Plan: -Treatment options discussed including all alternatives, risks, and complications -X-rays were obtained and reviewed. Prior partial first ray amputation. Partial second metatarsal head resection likely. This appears to be unchanged from her last appointment. 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 No evidence of acute cortical changes suggestive osteomy arise at this time. No soft tissue emphysema. -I long discussion with the patient regards to this wound in the healing process. This been ongoing for sometime unchanged. I discussed and  made to have surgical intervention to remove the third metatarsal head given the deformity. This is not a guarantee but if the wound continues C runs a risk of infection and further amputation. We will obtain arterial studies. The wound is debrided to without complications. Continue daily dressing changes for now. -Monitor for any clinical signs or symptoms of infection and directed to call the office immediately should any occur or go to the ER. -Follow-up in 3 weeks or sooner if any problems arise. In the meantime, encouraged to call the office with any questions, concerns, change in symptoms.    Celesta Gentile, DPM

## 2015-08-23 ENCOUNTER — Telehealth: Payer: Self-pay | Admitting: *Deleted

## 2015-08-23 DIAGNOSIS — R52 Pain, unspecified: Secondary | ICD-10-CM

## 2015-08-23 DIAGNOSIS — L97511 Non-pressure chronic ulcer of other part of right foot limited to breakdown of skin: Secondary | ICD-10-CM

## 2015-08-23 NOTE — Telephone Encounter (Addendum)
Orders faxed to [facility].  09/07/2015-DrJacqualyn Posey reviewed LE Arterial Duplex of 08/25/2015 as within normal limits. I informed pt and reminded him of his 09/12/2015 appt at 1115am.

## 2015-08-25 DIAGNOSIS — M79609 Pain in unspecified limb: Secondary | ICD-10-CM | POA: Diagnosis not present

## 2015-08-25 DIAGNOSIS — M7989 Other specified soft tissue disorders: Secondary | ICD-10-CM | POA: Diagnosis not present

## 2015-08-25 DIAGNOSIS — I739 Peripheral vascular disease, unspecified: Secondary | ICD-10-CM | POA: Diagnosis not present

## 2015-08-25 DIAGNOSIS — L97519 Non-pressure chronic ulcer of other part of right foot with unspecified severity: Secondary | ICD-10-CM | POA: Diagnosis not present

## 2015-08-29 ENCOUNTER — Ambulatory Visit (INDEPENDENT_AMBULATORY_CARE_PROVIDER_SITE_OTHER): Payer: Medicare Other | Admitting: *Deleted

## 2015-08-29 DIAGNOSIS — M14671 Charcot's joint, right ankle and foot: Secondary | ICD-10-CM

## 2015-08-30 NOTE — Progress Notes (Signed)
Patient ID: Michael Cantu, male   DOB: 20-Sep-1955, 60 y.o.   MRN: [id number] Patient presents to be casted for a brace with [facility] Certified Pedorthist.  Patient will return in 4 weeks to be fitted.

## 2015-09-12 ENCOUNTER — Ambulatory Visit (INDEPENDENT_AMBULATORY_CARE_PROVIDER_SITE_OTHER): Payer: Medicare Other | Admitting: Podiatry

## 2015-09-12 ENCOUNTER — Encounter: Payer: Self-pay | Admitting: Podiatry

## 2015-09-12 VITALS — BP 113/65 | HR 100 | Resp 18

## 2015-09-12 DIAGNOSIS — L97511 Non-pressure chronic ulcer of other part of right foot limited to breakdown of skin: Secondary | ICD-10-CM

## 2015-09-12 DIAGNOSIS — L89891 Pressure ulcer of other site, stage 1: Secondary | ICD-10-CM

## 2015-09-15 ENCOUNTER — Encounter: Payer: Self-pay | Admitting: Podiatry

## 2015-09-15 DIAGNOSIS — L97519 Non-pressure chronic ulcer of other part of right foot with unspecified severity: Secondary | ICD-10-CM | POA: Insufficient documentation

## 2015-09-15 NOTE — Progress Notes (Signed)
Patient ID: Michael Cantu, male   DOB: [birth year], 60 y.o.   MRN: [id number]  Subjective: 60 year old male presents the office for follow up evaluation of right foot submetatarsal 3 ulceration. He states that he feels he is continues to do well he has not had any drainage from the wound recently. No redness or red streaks. No swelling. He is awaiting the brace that he was casted for.  Denies any systemic complaints as fevers, chills, nausea, vomiting. No pain with calf compression, chest pain, shortness of breath. No acute changes since last appointment.   Objective: AAO 3, NAD DP/PT pulses decrease, CRT less than 3 seconds  Protective sensation decreased with Simms Weinstein monofilament Right foot submetarsal 3 is an annular ulceration with hyperkeratotic periwound.  After debridement to the wound measures 0.4 x 0.3 cm and appears to be about  the same since last appointment. 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 There is prominence of the metatarsal head. There is no surrounding erythema, ascending cellulitis, fluctuance, crepitus, malodor, drainage or purulence. There is no pain with calf compression, swelling, warmth, erythema.  Assessment: 60 year old male right foot ulceration, no clinical signs of infection  Plan: -Treatment options discussed including all alternatives, risks, and complications -Awaiting vascular studies -With his [place]se granular healthy tissue. Continue with daily dressing changes. He was started on surgical intervention. Awaiting the brace. Monitor for signs or symptoms of infection. Follow-up in 3 weeks or sooner if needed.  Celesta Gentile, DPM

## 2015-10-03 ENCOUNTER — Encounter: Payer: Self-pay | Admitting: Podiatry

## 2015-10-03 ENCOUNTER — Ambulatory Visit: Payer: Medicare Other | Admitting: *Deleted

## 2015-10-03 ENCOUNTER — Ambulatory Visit (INDEPENDENT_AMBULATORY_CARE_PROVIDER_SITE_OTHER): Payer: Medicare Other | Admitting: Podiatry

## 2015-10-03 VITALS — BP 119/66 | HR 89 | Resp 18

## 2015-10-03 DIAGNOSIS — M24274 Disorder of ligament, right foot: Secondary | ICD-10-CM | POA: Diagnosis not present

## 2015-10-03 DIAGNOSIS — Z89421 Acquired absence of other right toe(s): Secondary | ICD-10-CM | POA: Diagnosis not present

## 2015-10-03 DIAGNOSIS — M14671 Charcot's joint, right ankle and foot: Secondary | ICD-10-CM | POA: Diagnosis not present

## 2015-10-03 DIAGNOSIS — L97511 Non-pressure chronic ulcer of other part of right foot limited to breakdown of skin: Secondary | ICD-10-CM

## 2015-10-03 DIAGNOSIS — R262 Difficulty in walking, not elsewhere classified: Secondary | ICD-10-CM | POA: Diagnosis not present

## 2015-10-03 DIAGNOSIS — Z89411 Acquired absence of right great toe: Secondary | ICD-10-CM | POA: Diagnosis not present

## 2015-10-03 NOTE — Progress Notes (Signed)
Patient ID: Michael Cantu, male   DOB: 11-05-1955, 60 y.o.   MRN: [id number] Patient presents for fitting of [place] with [facility] Certified Pedorthist. Written and verbal break in instructions given. Patient will follow up in 6 weeks

## 2015-10-06 ENCOUNTER — Encounter: Payer: Self-pay | Admitting: Podiatry

## 2015-10-06 NOTE — Progress Notes (Signed)
Patient ID: Michael Cantu, male   DOB: 1955-12-25, 60 y.o.   MRN: [id number]  Subjective: 60 year old male presents the office for follow up evaluation of right foot submetatarsal 3 ulceration. He presents today with his wife who does the daily dressing changes. Denies any drainage or pus or any redness. It does bleed at times however. No clear drainage either. No cellulitis or red streaks. States the wound is on the same. As presented to see Benjie Karvonen to get fitted for [facility] brace to help offload the symptomatic area.  Objective: AAO 3, NAD DP/PT pulses decrease, CRT less than 3 seconds  Protective sensation decreased with Simms Weinstein monofilament Right foot submetarsal 3 is an annular ulceration with hyperkeratotic periwound.  After debridement to the wound measures 0.4 x 0.3 cm and appears to be about  the same since last appointment and there has been no real improvment. 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 There is prominence of the metatarsal head. There is no surrounding erythema, ascending cellulitis, fluctuance, crepitus, malodor, drainage or purulence. There is no pain with calf compression, swelling, warmth, erythema.  Assessment: 60 year old male right foot ulceration, no clinical signs of infection  Plan: -Treatment options discussed including all alternatives, risks, and complications -Wound debrided to granular healthy tissue. The medications be superficial. Be unchanged. No signs of infection. Continue daily dressing changes. Hopefully offloading better with the [place] brace will help take pressure to heal the wound. Discussed if not impossible surgical intervention remove the third metatarsal head. -Monitor for any clinical signs or symptoms of infection and directed to call the office immediately should any occur or go to the ER. -Follow-up in 3 weeks or sooner if any problems arise. In the meantime, encouraged to call the office with any  questions, concerns, change in symptoms.   Celesta Gentile, DPM

## 2015-10-11 DIAGNOSIS — E1165 Type 2 diabetes mellitus with hyperglycemia: Secondary | ICD-10-CM | POA: Diagnosis not present

## 2015-10-11 DIAGNOSIS — I10 Essential (primary) hypertension: Secondary | ICD-10-CM | POA: Diagnosis not present

## 2015-10-11 DIAGNOSIS — J45901 Unspecified asthma with (acute) exacerbation: Secondary | ICD-10-CM | POA: Diagnosis not present

## 2015-10-11 DIAGNOSIS — L409 Psoriasis, unspecified: Secondary | ICD-10-CM | POA: Diagnosis not present

## 2015-10-17 DIAGNOSIS — E1140 Type 2 diabetes mellitus with diabetic neuropathy, unspecified: Secondary | ICD-10-CM | POA: Diagnosis not present

## 2015-10-17 DIAGNOSIS — I10 Essential (primary) hypertension: Secondary | ICD-10-CM | POA: Diagnosis not present

## 2015-10-17 DIAGNOSIS — M216X1 Other acquired deformities of right foot: Secondary | ICD-10-CM | POA: Diagnosis not present

## 2015-10-17 DIAGNOSIS — I739 Peripheral vascular disease, unspecified: Secondary | ICD-10-CM | POA: Diagnosis not present

## 2015-10-17 DIAGNOSIS — E1165 Type 2 diabetes mellitus with hyperglycemia: Secondary | ICD-10-CM | POA: Diagnosis not present

## 2015-11-07 ENCOUNTER — Ambulatory Visit (INDEPENDENT_AMBULATORY_CARE_PROVIDER_SITE_OTHER): Payer: Medicare Other | Admitting: Podiatry

## 2015-11-07 ENCOUNTER — Encounter: Payer: Self-pay | Admitting: Podiatry

## 2015-11-07 VITALS — BP 116/65 | HR 101 | Resp 18

## 2015-11-07 DIAGNOSIS — L89891 Pressure ulcer of other site, stage 1: Secondary | ICD-10-CM | POA: Diagnosis not present

## 2015-11-07 DIAGNOSIS — L97511 Non-pressure chronic ulcer of other part of right foot limited to breakdown of skin: Secondary | ICD-10-CM

## 2015-11-07 NOTE — Progress Notes (Signed)
Patient ID: Michael Cantu, male   DOB: 1955-11-11, 60 y.o.   MRN: [id number]  Subjective: 60 year old male presents the office for follow up evaluation of right foot submetatarsal 3 ulceration. He states his been wearing the brace is been doing well. He states his wife believes that the wound is getting some skin over top of it. Denies any drainage or pus 3 swelling or warmth or redness of his foot. Denies any systemic complaints such as fevers, chills, nausea, vomiting. No calf pain, chest pain, sureness of breath.  Objective: AAO 3, NAD DP/PT pulses decrease, CRT less than 3 seconds  Protective sensation decreased with Simms Weinstein monofilament Right foot submetarsal 3 is an annular ulceration with a thick hyperkeratotic periwound.  After debridement to the wound measures 0.4 x 0.4 cm and appears to be about  the same since last appointment and there has been no real improvment. 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 There is prominence of the metatarsal head. There is no surrounding erythema, ascending cellulitis, fluctuance, crepitus, malodor, drainage or purulence. There is no pain with calf compression, swelling, warmth, erythema.  Assessment: 60 year old male right foot ulceration, no clinical signs of infection  Plan: -Treatment options discussed including all alternatives, risks, and complications -Wound sharply debrided today without complications to reveal underlying ulceration which is been unchanged now for several visits. I discussed with him surgical intervention savored to remove the metatarsal head. I'll see him back in 2 weeks and there is no improvement that time we'll likely proceed with surgical intervention. Monitor for signs or symptoms of infection to call the office medially should any occur or go to the ER. Contiue with the Brace.   Celesta Gentile, DPM

## 2015-11-21 ENCOUNTER — Ambulatory Visit (INDEPENDENT_AMBULATORY_CARE_PROVIDER_SITE_OTHER): Payer: Medicare Other | Admitting: Podiatry

## 2015-11-21 ENCOUNTER — Ambulatory Visit (INDEPENDENT_AMBULATORY_CARE_PROVIDER_SITE_OTHER): Payer: Medicare Other

## 2015-11-21 DIAGNOSIS — M79671 Pain in right foot: Secondary | ICD-10-CM | POA: Diagnosis not present

## 2015-11-21 DIAGNOSIS — L89891 Pressure ulcer of other site, stage 1: Secondary | ICD-10-CM

## 2015-11-21 DIAGNOSIS — M216X1 Other acquired deformities of right foot: Secondary | ICD-10-CM

## 2015-11-21 DIAGNOSIS — L97511 Non-pressure chronic ulcer of other part of right foot limited to breakdown of skin: Secondary | ICD-10-CM

## 2015-11-21 NOTE — Patient Instructions (Signed)

## 2015-11-21 NOTE — Progress Notes (Signed)
Patient ID: Michael Cantu, male   DOB: Nov 21, 1955, 60 y.o.   MRN: [id number]  Subjective: 60 year old male presents the office for follow up evaluation of right foot submetatarsal 3 ulceration. He states the wound continues to presents to his wife. He states he feels that he has not had much improvement in the wound he is getting frustrated. At this point elected to do something more definitive to help with the wound. Has has a callus to both feet which should have trimmed. Denies any redness or drainage from the area. He denies any pus or redness or any surrounding swelling to the wound on the bottom of the right foot. Denies any systemic complaints such as fevers, chills, nausea, vomiting. No calf pain, chest pain, sureness of breath.  Objective: AAO 3, NAD DP/PT pulses decrease, CRT less than 3 seconds  Protective sensation decreased with Simms Weinstein monofilament Right foot submetarsal 3 is an annular ulceration with a thick hyperkeratotic periwound.  After debridement to the wound measures 0.4 x 0.4 cm and appears to be about  the same since last appointment and there has been no real improvment. 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 There is prominence of the metatarsal head. Hyperkeratotic lesion foot first partial ray amputation site as well as left foot. No undyed ulceration, drainage or other signs of infection. There is no surrounding erythema, ascending cellulitis, fluctuance, crepitus, malodor, drainage or purulence. There is no pain with calf compression, swelling, warmth, erythema.  Assessment: 60 year old male right foot chronic ulceration, no clinical signs of infection; hyperkeratotic lesions  Plan: -Treatment options discussed including all alternatives, risks, and complications -Will was debrided to the scalpel down a granular healthy tissue. Overall the wound appears to be unchanged. X-rays were taken and reviewed. A marker was placed around  the wound. Reveals subluxation/chronic dislocation of the third MPJ with prominent metatarsal head. No definitive evidence of acute also myelitis this time. As his wound is been ongoing for several years and been reoccurring I discussed with him third metatarsal head excision. He wishes proceed with surgery. -The incision placement as well as the postoperative course was discussed with the patient. I discussed risks of the surgery which include, but not limited to, infection, bleeding, pain, swelling, need for further surgery, delayed or nonhealing, painful or ugly scar, numbness or sensation changes, over/under correction, recurrence, transfer lesions, further deformity, hardware failure, DVT/PE, loss of toe/foot. Patient understands these risks and wishes to proceed with surgery. The surgical consent was reviewed with the patient all 3 pages were signed. No promises or guarantees were given to the outcome of the procedure. All questions were answered to the best of my ability. Before the surgery the patient was encouraged to call the office if there is any further questions. The surgery will be performed at the [facility] on an outpatient basis.  Celesta Gentile, DPM

## 2015-12-06 ENCOUNTER — Encounter: Payer: Self-pay | Admitting: Podiatry

## 2015-12-06 DIAGNOSIS — M868X7 Other osteomyelitis, ankle and foot: Secondary | ICD-10-CM | POA: Diagnosis not present

## 2015-12-07 ENCOUNTER — Telehealth: Payer: Self-pay | Admitting: *Deleted

## 2015-12-07 NOTE — Telephone Encounter (Signed)
"  I'm calling to see how much my co-pay will be."  You will not have to pay [place] anything.  You will need to call the surgical center to get their fee.  "Okay, I'll do that.  Thank you."

## 2015-12-14 ENCOUNTER — Encounter: Payer: Self-pay | Admitting: Podiatry

## 2015-12-14 DIAGNOSIS — L97514 Non-pressure chronic ulcer of other part of right foot with necrosis of bone: Secondary | ICD-10-CM | POA: Diagnosis not present

## 2015-12-14 DIAGNOSIS — L89893 Pressure ulcer of other site, stage 3: Secondary | ICD-10-CM | POA: Diagnosis not present

## 2015-12-14 DIAGNOSIS — S91301A Unspecified open wound, right foot, initial encounter: Secondary | ICD-10-CM | POA: Diagnosis not present

## 2015-12-14 DIAGNOSIS — M1990 Unspecified osteoarthritis, unspecified site: Secondary | ICD-10-CM | POA: Diagnosis not present

## 2015-12-14 DIAGNOSIS — M21541 Acquired clubfoot, right foot: Secondary | ICD-10-CM | POA: Diagnosis not present

## 2015-12-14 DIAGNOSIS — M216X1 Other acquired deformities of right foot: Secondary | ICD-10-CM | POA: Diagnosis not present

## 2015-12-14 DIAGNOSIS — M722 Plantar fascial fibromatosis: Secondary | ICD-10-CM | POA: Diagnosis not present

## 2015-12-16 ENCOUNTER — Ambulatory Visit (INDEPENDENT_AMBULATORY_CARE_PROVIDER_SITE_OTHER): Payer: Medicare Other | Admitting: Podiatry

## 2015-12-16 ENCOUNTER — Ambulatory Visit (INDEPENDENT_AMBULATORY_CARE_PROVIDER_SITE_OTHER): Payer: Medicare Other

## 2015-12-16 DIAGNOSIS — M216X1 Other acquired deformities of right foot: Secondary | ICD-10-CM

## 2015-12-16 DIAGNOSIS — Z09 Encounter for follow-up examination after completed treatment for conditions other than malignant neoplasm: Secondary | ICD-10-CM

## 2015-12-16 DIAGNOSIS — M14671 Charcot's joint, right ankle and foot: Secondary | ICD-10-CM

## 2015-12-16 DIAGNOSIS — L97511 Non-pressure chronic ulcer of other part of right foot limited to breakdown of skin: Secondary | ICD-10-CM

## 2015-12-19 ENCOUNTER — Encounter: Payer: Self-pay | Admitting: Podiatry

## 2015-12-23 ENCOUNTER — Ambulatory Visit (INDEPENDENT_AMBULATORY_CARE_PROVIDER_SITE_OTHER): Payer: Medicare Other | Admitting: Podiatry

## 2015-12-23 ENCOUNTER — Encounter: Payer: Self-pay | Admitting: Podiatry

## 2015-12-23 VITALS — Temp 98.5°F

## 2015-12-23 DIAGNOSIS — L97511 Non-pressure chronic ulcer of other part of right foot limited to breakdown of skin: Secondary | ICD-10-CM

## 2015-12-23 DIAGNOSIS — M14671 Charcot's joint, right ankle and foot: Secondary | ICD-10-CM

## 2015-12-23 DIAGNOSIS — Z09 Encounter for follow-up examination after completed treatment for conditions other than malignant neoplasm: Secondary | ICD-10-CM

## 2015-12-26 NOTE — Progress Notes (Signed)
Patient ID: Michael Cantu, male   DOB: [birth year], 60 y.o.   MRN: [id number] Subjective: Michael Cantu is a 61 y.o. is seen today in office s/p right 3rd metatarsal head resection.Marland KitchenHe's been continuing antibiotics. Denies any pain. Has continue with surgical shoe.  Denies any systemic complaints such as fevers, chills, nausea, vomiting. No calf pain, chest pain, shortness of breath.   Objective: General: No acute distress, AAOx3  DP/PT pulses palpable 2/4, CRT < 3 sec to all digits.  Motor function intact.  Right foot: Incision is well coapted on the dorsal foot with mild superficial dehiscence however the sutures are not holding at this time. Upon removal of sutures to wound is very superficial and there is no probing. There is no drainage or pus expressed today. There is minimal edema to the area any erythema or increase in warmth. Submetatarsal 3 ulceration appears to be filling in and there is no drainage or pus. No other areas of tenderness to bilateral lower extremities.  No other open lesions or pre-ulcerative lesions.  No pain with calf compression, swelling, warmth, erythema.   Assessment and Plan:  Status post Right 3rd metatarsal head resection, doing well with no complications   -Treatment options discussed including all alternatives, risks, and complications -Sutures removed today without complications. At this time recommended a saline with a dry packing to the wound as well as anabolic limited to the dorsal incision. Continue with surgical shoe. Continue antibiotics. Limit weightbearing. Follow-up in 1 week or sooner if needed. Meantime call any questions or concerns. Monitor for any signs or symptoms of infection and directed to the ER should any occur call the office.  Celesta Gentile, DPM

## 2015-12-26 NOTE — Progress Notes (Signed)
Patient ID: JABRILL PAYANT, male   DOB: Aug 06, 1955, 60 y.o.   MRN: [id number] Subjective: RYDIN BRADEN is a 60 y.o. is seen today in office s/p right 3rd metatarsal head resection preformed on 2 days ago.He's been continuing antibiotics. Denies any pain. Has continue with surgical shoe. He presents today for dressing change. Denies any systemic complaints such as fevers, chills, nausea, vomiting. No calf pain, chest pain, shortness of breath.   Objective: General: No acute distress, AAOx3  DP/PT pulses palpable 2/4, CRT < 3 sec to all digits.  Motor function intact.  Right foot: Incision is well coapted on the dorsal foot with mild superficial dehiscence. There is no drainage or pus expressed today. There is minimal edema to the area any erythema or increase in warmth. Submetatarsal 3 ulceration appears to be filling in and there is no drainage or pus. No other areas of tenderness to bilateral lower extremities.  No other open lesions or pre-ulcerative lesions.  No pain with calf compression, swelling, warmth, erythema.   Assessment and Plan:  Status post Right 3rd metatarsal head resection, doing well with no complications   -Treatment options discussed including all alternatives, risks, and complications -X-rays were obtained and reviewed with the patient. Status post third metatarsal head resection. Previous amputation of partial first and second rays. -Dressing was changed today. Saline wet-to-dry packing was applied to the wound and antibiotic ointment to the dorsal incision. -Continue antibiotics -Surgical shoe. Limit weightbearing -Continue to monitor for any signs or symptoms of infection present to call the office or go to the ER. Follow-up scheduled or sooner if needed. Call any questions concerns meantime.  Celesta Gentile, DPM

## 2015-12-30 ENCOUNTER — Ambulatory Visit (INDEPENDENT_AMBULATORY_CARE_PROVIDER_SITE_OTHER): Payer: Medicare Other | Admitting: Podiatry

## 2015-12-30 VITALS — Temp 97.4°F

## 2015-12-30 DIAGNOSIS — L97511 Non-pressure chronic ulcer of other part of right foot limited to breakdown of skin: Secondary | ICD-10-CM

## 2015-12-30 DIAGNOSIS — Z09 Encounter for follow-up examination after completed treatment for conditions other than malignant neoplasm: Secondary | ICD-10-CM

## 2016-01-08 NOTE — Progress Notes (Signed)
Patient ID: Michael Cantu, male   DOB: 02/03/1956, 60 y.o.   MRN: [id number]  Subjective: Michael Cantu is a 60 y.o. is seen today in office s/p right 3rd metatarsal head resection.Marland KitchenHe's been continuing antibiotics. Denies any pain at this time and states that he is doing well. Has continue with surgical shoe.  Denies any systemic complaints such as fevers, chills, nausea, vomiting. No calf pain, chest pain, shortness of breath.   Objective: General: No acute distress, AAOx3  DP/PT pulses palpable 2/4, CRT < 3 sec to all digits.  Motor function intact.  Right foot: Incision on the dorsal aspect appears to be healing well and there is no probing, undermining, tunneling. There is no surrounding erythema, ascending cellulitis, fluctuance, crepitance. Trace edema.  Submetatarsal 3 ulceration appears to be filling in and is more superficial with granular wound base and this has improved. There is no drainage, malodor. No clinical signs of infection.  No other areas of tenderness to bilateral lower extremities.  No other open lesions or pre-ulcerative lesions.  No pain with calf compression, swelling, warmth, erythema.   Assessment and Plan:  Status post Right 3rd metatarsal head resection, doing well with no complications   -Treatment options discussed including all alternatives, risks, and complications -Continue daily dressing changes to the ulcer. Continue to pack the wound as discussed.  -Monitor for any clinical signs or symptoms of infection and directed to call the office immediately should any occur or go to the ER. -Surgical shoe and offloading -Follow-up as scheduled or sooner if any problems arise. In the meantime, encouraged to call the office with any questions, concerns, change in symptoms.   Celesta Gentile, DPM

## 2016-01-13 ENCOUNTER — Ambulatory Visit (INDEPENDENT_AMBULATORY_CARE_PROVIDER_SITE_OTHER): Payer: Medicare Other | Admitting: Podiatry

## 2016-01-13 ENCOUNTER — Encounter: Payer: Self-pay | Admitting: Podiatry

## 2016-01-13 DIAGNOSIS — L89891 Pressure ulcer of other site, stage 1: Secondary | ICD-10-CM | POA: Diagnosis not present

## 2016-01-13 DIAGNOSIS — L97511 Non-pressure chronic ulcer of other part of right foot limited to breakdown of skin: Secondary | ICD-10-CM

## 2016-01-13 DIAGNOSIS — Z09 Encounter for follow-up examination after completed treatment for conditions other than malignant neoplasm: Secondary | ICD-10-CM

## 2016-01-13 NOTE — Progress Notes (Signed)
Patient ID: Michael Cantu, male   DOB: 01-10-1956, 60 y.o.   MRN: [id number]  Subjective: Michael Cantu is a 60 y.o. 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 His wife states the areas looking much better and she has been packing the area less. Denies any drainage or pus and denies any swelling redness or red streaks. Denies any systemic complaints such as fevers, chills, nausea, vomiting. No calf pain, chest pain, shortness of breath.   Objective: General: No acute distress, AAOx3  DP/PT pulses palpable 2/4, CRT < 3 sec to all digits.  Motor function intact.  Right foot: Incision on the dorsal aspect appears to be healing well and there is no probing, undermining, tunneling. Decreased amount of hyperkerotic tissue around the area. The wound after debridement measures 0.9 x 0.8 x 0.5 cm. There is no probe to bone, undermining, tunneling.  No other areas of tenderness to bilateral lower extremities.  No other open lesions  No pain with calf compression, swelling, warmth, erythema.   Assessment and Plan:  Status post Right 3rd metatarsal head resection, doing well with no complications   -Treatment options discussed including all alternatives, risks, and complications -Wound was sharply debrided today down to healthy, granular tissue. Continue with packing to the area daily. Continue with offloading shoe. Monitoring signs or symptoms of infection of the ER should any occur call the office. -Follow-up in 3 weeks or sooner if needed.  Celesta Gentile, DPM

## 2016-02-03 ENCOUNTER — Encounter: Payer: Self-pay | Admitting: Podiatry

## 2016-02-03 ENCOUNTER — Ambulatory Visit (INDEPENDENT_AMBULATORY_CARE_PROVIDER_SITE_OTHER): Payer: Medicare Other | Admitting: Podiatry

## 2016-02-03 DIAGNOSIS — L97511 Non-pressure chronic ulcer of other part of right foot limited to breakdown of skin: Secondary | ICD-10-CM

## 2016-02-03 DIAGNOSIS — L89891 Pressure ulcer of other site, stage 1: Secondary | ICD-10-CM | POA: Diagnosis not present

## 2016-02-03 NOTE — Progress Notes (Signed)
Patient ID: Michael Cantu, male   DOB: 1956/03/11, 60 y.o.   MRN: [id number]  Subjective: Michael Cantu is a 60 y.o. 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 He has been applying iodine packing overlying the wound daily. He denies any drainage or pus or any swelling redness or swelling or any drainage. He is continued surgical shoe with an offloading pad. Denies any systemic complaints such as fevers, chills, nausea, vomiting. No calf pain, chest pain, shortness of breath.   Objective: General: No acute distress, AAOx3  DP/PT pulses palpable 2/4, CRT < 3 sec to all digits.  Motor function intact.  Right foot: Incision on the dorsal aspect appears to be healing well and a scar is formed. In the plantar aspect submetatarsal 3 of the right foot is hyperkeratotic lesion. Upon debridement there is a small superficial wound in the wound appears to be much improved compared to last appointment before surgery. There is no probing, undermining or tunneling there is no swelling erythema, ascending cellulitis, fluctuance, crepitus, malodor. The wound measures 0.2 x 0.2 x 0.1 cm. There is no other open lesions at this time.  No pain with calf compression, swelling, warmth, erythema.   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 Silvadene was applied followed by a bandage. Continue daily dressing changes. Continue a surgical shoe. Under very signs or symptoms of infection to the ER should any occur. Osteophyte in 3 weeks or sooner if needed. Call any questions or concerns to meantime.  Celesta Gentile, DPM-

## 2016-02-24 ENCOUNTER — Ambulatory Visit: Payer: Medicare Other | Admitting: Podiatry

## 2016-02-27 ENCOUNTER — Ambulatory Visit: Payer: Medicare Other | Admitting: Podiatry

## 2016-03-05 ENCOUNTER — Ambulatory Visit (INDEPENDENT_AMBULATORY_CARE_PROVIDER_SITE_OTHER): Payer: Medicare Other | Admitting: Podiatry

## 2016-03-05 ENCOUNTER — Ambulatory Visit (INDEPENDENT_AMBULATORY_CARE_PROVIDER_SITE_OTHER): Payer: Medicare Other

## 2016-03-05 ENCOUNTER — Encounter: Payer: Self-pay | Admitting: Podiatry

## 2016-03-05 DIAGNOSIS — E1149 Type 2 diabetes mellitus with other diabetic neurological complication: Secondary | ICD-10-CM | POA: Diagnosis not present

## 2016-03-05 DIAGNOSIS — L97511 Non-pressure chronic ulcer of other part of right foot limited to breakdown of skin: Secondary | ICD-10-CM

## 2016-03-05 DIAGNOSIS — Z09 Encounter for follow-up examination after completed treatment for conditions other than malignant neoplasm: Secondary | ICD-10-CM

## 2016-03-05 DIAGNOSIS — B351 Tinea unguium: Secondary | ICD-10-CM

## 2016-03-05 DIAGNOSIS — M79674 Pain in right toe(s): Secondary | ICD-10-CM

## 2016-03-05 DIAGNOSIS — L89891 Pressure ulcer of other site, stage 1: Secondary | ICD-10-CM

## 2016-03-05 DIAGNOSIS — M79675 Pain in left toe(s): Secondary | ICD-10-CM

## 2016-03-05 DIAGNOSIS — L84 Corns and callosities: Secondary | ICD-10-CM

## 2016-03-05 NOTE — Progress Notes (Signed)
Patient ID: Michael Cantu, male   DOB: Jun 27, 1955, 60 y.o.   MRN: [id number]  Subjective: Michael Cantu is a 60 y.o. 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 States easily identified and a Band-Aid on the wound on the right foot submetatarsal 3C bleeds of the areas healed. He is not having any redness or drainage or any swelling. He also presents today for thick, painful, elongated toenails that he cannot trim himself. Denies any recent redness or drainage from the toenails. No other complaints at this time.   Objective: General: No acute distress, AAOx3  DP/PT pulses palpable 2/4, CRT < 3 sec to all digits.  Motor function intact.  Right foot: Incision on the dorsal aspect appears to be healing well and a scar is formed. In the plantar aspect submetatarsal 3 is a small amount of hyperkeratotic tissue. Upon debridement the wound appears to be healed today. There is no other open lesions or pre-ulcerative lesions. There is no drainage or pus in there is no edema, erythema, increase in warmth. Nails are hypertrophic, dystrophic, brittle, discolored, elongated 8. There is no swelling erythema, drainage or pus any signs of infection. There is tenderness the remaining toenails.  No pain with calf compression, swelling, warmth, erythema.   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 The wound appears to be healed today. Monitor for any recurrence. Continue offloading. -Nail sharply debrided 8 without, occasions or bleeding. -Daily foot inspection. Monitor for any skin breakdown or any new ulceration. -Follow-up in 9 weeks for  routine care or sooner if any problems arise. In the meantime, encouraged to call the office with any questions, concerns, change in symptoms.   Celesta Gentile, DPM

## 2016-03-19 DIAGNOSIS — Z23 Encounter for immunization: Secondary | ICD-10-CM | POA: Diagnosis not present

## 2016-04-20 DIAGNOSIS — I739 Peripheral vascular disease, unspecified: Secondary | ICD-10-CM | POA: Diagnosis not present

## 2016-04-20 DIAGNOSIS — J45901 Unspecified asthma with (acute) exacerbation: Secondary | ICD-10-CM | POA: Diagnosis not present

## 2016-04-20 DIAGNOSIS — E1165 Type 2 diabetes mellitus with hyperglycemia: Secondary | ICD-10-CM | POA: Diagnosis not present

## 2016-04-20 DIAGNOSIS — E1140 Type 2 diabetes mellitus with diabetic neuropathy, unspecified: Secondary | ICD-10-CM | POA: Diagnosis not present

## 2016-04-20 DIAGNOSIS — I10 Essential (primary) hypertension: Secondary | ICD-10-CM | POA: Diagnosis not present

## 2016-04-24 DIAGNOSIS — M216X1 Other acquired deformities of right foot: Secondary | ICD-10-CM | POA: Diagnosis not present

## 2016-04-24 DIAGNOSIS — I10 Essential (primary) hypertension: Secondary | ICD-10-CM | POA: Diagnosis not present

## 2016-04-24 DIAGNOSIS — E1140 Type 2 diabetes mellitus with diabetic neuropathy, unspecified: Secondary | ICD-10-CM | POA: Diagnosis not present

## 2016-04-24 DIAGNOSIS — I739 Peripheral vascular disease, unspecified: Secondary | ICD-10-CM | POA: Diagnosis not present

## 2016-05-07 ENCOUNTER — Encounter: Payer: Self-pay | Admitting: Podiatry

## 2016-05-07 ENCOUNTER — Ambulatory Visit (INDEPENDENT_AMBULATORY_CARE_PROVIDER_SITE_OTHER): Payer: Medicare Other | Admitting: Podiatry

## 2016-05-07 DIAGNOSIS — M79675 Pain in left toe(s): Secondary | ICD-10-CM | POA: Diagnosis not present

## 2016-05-07 DIAGNOSIS — E1149 Type 2 diabetes mellitus with other diabetic neurological complication: Secondary | ICD-10-CM

## 2016-05-07 DIAGNOSIS — L84 Corns and callosities: Secondary | ICD-10-CM

## 2016-05-07 DIAGNOSIS — M79674 Pain in right toe(s): Secondary | ICD-10-CM | POA: Diagnosis not present

## 2016-05-07 DIAGNOSIS — B351 Tinea unguium: Secondary | ICD-10-CM | POA: Diagnosis not present

## 2016-05-07 NOTE — Progress Notes (Signed)
Subjective: 61 y.o. returns the office today for painful, elongated, thickened toenails which he cannot trim himself as well as for calluses to his feet. He does pick at his calluses and they do bleed as he picks them at times.He states the surgical sites are doing well and he has not noticed any skin break down or ulcers. Denies any redness or drainage around the nails. Denies any acute changes since last appointment and no new complaints today. Denies any systemic complaints such as fevers, chills, nausea, vomiting.   Objective: AAO 3, NAD DP/PT pulses palpable, CRT less than 3 seconds Decreased sensation with SWMF Nails hypertrophic, dystrophic, elongated, brittle, discolored 8. There is tenderness overlying the nails 1-5 on the left and 3-5 on the right. There is no surrounding erythema or drainage along the nail sites. Hyperkeratotic tissue along the left lateral foot as well as right Upon debridement no underlying ulceration, drainage or any signs of infection. There is evidence of dried blood on the right foot on the callus where he was picking at the callus.  No open lesions or pre-ulcerative lesions are identified. No other areas of tenderness bilateral lower extremities. No overlying edema, erythema, increased warmth. No pain with calf compression, swelling, warmth, erythema.  Assessment: Patient presents with symptomatic onychomycosis; pre-ulcerative callus  Plan: -Treatment options including alternatives, risks, complications were discussed -Nails sharply debrided 8 without complication/bleeding -Hyperkeratotic tissue debrided x 2 without complications or bleeding.  -Discussed daily foot inspection. If there are any changes, to call the office immediately.  -Follow-up in 9 weeks or sooner if any problems are to arise. In the meantime, encouraged to call the office with any questions, concerns, changes symptoms.  Celesta Gentile, DPM

## 2016-07-09 ENCOUNTER — Ambulatory Visit: Payer: Medicare Other | Admitting: Podiatry

## 2016-07-16 ENCOUNTER — Encounter: Payer: Self-pay | Admitting: Podiatry

## 2016-07-16 ENCOUNTER — Ambulatory Visit (INDEPENDENT_AMBULATORY_CARE_PROVIDER_SITE_OTHER): Payer: Medicare Other | Admitting: Podiatry

## 2016-07-16 DIAGNOSIS — M79675 Pain in left toe(s): Secondary | ICD-10-CM

## 2016-07-16 DIAGNOSIS — L84 Corns and callosities: Secondary | ICD-10-CM | POA: Diagnosis not present

## 2016-07-16 DIAGNOSIS — M79674 Pain in right toe(s): Secondary | ICD-10-CM

## 2016-07-16 DIAGNOSIS — B351 Tinea unguium: Secondary | ICD-10-CM

## 2016-07-16 DIAGNOSIS — E1149 Type 2 diabetes mellitus with other diabetic neurological complication: Secondary | ICD-10-CM

## 2016-07-16 NOTE — Progress Notes (Signed)
Subjective: 61 y.o. returns the office today for painful, elongated, thickened toenails which he cannot trim himself as well as for calluses to his feet. Denies any redness or drainage around the nails. Denies any acute changes since last appointment and no new complaints today. Denies any systemic complaints such as fevers, chills, nausea, vomiting.   Objective: AAO 3, NAD DP/PT pulses palpable, CRT less than 3 seconds Decreased sensation with SWMF Nails hypertrophic, dystrophic, elongated, brittle, discolored 8. There is tenderness overlying the nails 1-5 on the left and 3-5 on the right. There is no surrounding erythema or drainage along the nail sites. Hyperkeratotic tissue along the left lateral foot and right medial foot. Upon debridement no underlying ulceration, drainage or any signs of infection. There is evidence of dried blood on the right foot on the callus where he was picking at the callus.  No open lesions or pre-ulcerative lesions are identified. No other areas of tenderness bilateral lower extremities. No overlying edema, erythema, increased warmth. No pain with calf compression, swelling, warmth, erythema.  Assessment: Patient presents with symptomatic onychomycosis; pre-ulcerative callus  Plan: -Treatment options including alternatives, risks, complications were discussed -Nails sharply debrided 8 without complication/bleeding -Hyperkeratotic tissue debrided x 2 without complications or bleeding.  -Discussed daily foot inspection. If there are any changes, to call the office immediately.  -Follow-up in 9 weeks or sooner if any problems are to arise. In the meantime, encouraged to call the office with any questions, concerns, changes symptoms.  Celesta Gentile, DPM

## 2016-09-17 ENCOUNTER — Ambulatory Visit (INDEPENDENT_AMBULATORY_CARE_PROVIDER_SITE_OTHER): Payer: Medicare Other | Admitting: Podiatry

## 2016-09-17 ENCOUNTER — Encounter: Payer: Self-pay | Admitting: Podiatry

## 2016-09-17 DIAGNOSIS — M79674 Pain in right toe(s): Secondary | ICD-10-CM | POA: Diagnosis not present

## 2016-09-17 DIAGNOSIS — M79675 Pain in left toe(s): Secondary | ICD-10-CM | POA: Diagnosis not present

## 2016-09-17 DIAGNOSIS — L84 Corns and callosities: Secondary | ICD-10-CM | POA: Diagnosis not present

## 2016-09-17 DIAGNOSIS — E1149 Type 2 diabetes mellitus with other diabetic neurological complication: Secondary | ICD-10-CM

## 2016-09-17 DIAGNOSIS — B351 Tinea unguium: Secondary | ICD-10-CM

## 2016-09-18 NOTE — Progress Notes (Signed)
Subjective: 61 y.o. returns the office today for painful, elongated, thickened toenails which he cannot trim himself as well as for calluses to his feet. Denies any redness or drainage around the nails. Denies any acute changes since last appointment and no new complaints today. Denies any systemic complaints such as fevers, chills, nausea, vomiting.   Objective: AAO 3, NAD DP/PT pulses palpable, CRT less than 3 seconds Decreased sensation with SWMF Nails hypertrophic, dystrophic, elongated, brittle, discolored 8. There is tenderness overlying the nails 1-5 on the left and 3-5 on the right. There is no surrounding erythema or drainage along the nail sites. Hyperkeratotic tissue along the left lateral foot and right medial foot. Upon debridement no underlying ulceration, drainage or any signs of infection. There is evidence of dried blood on the right foot on the callus where he was picking at the callus.  No open lesions or pre-ulcerative lesions are identified. Overall doing well without any significant change.  No other areas of tenderness bilateral lower extremities. No overlying edema, erythema, increased warmth. No pain with calf compression, swelling, warmth, erythema.  Assessment: Patient presents with symptomatic onychomycosis; pre-ulcerative callus  Plan: -Treatment options including alternatives, risks, complications were discussed -Nails sharply debrided 8 without complication/bleeding -Hyperkeratotic tissue debrided x 2 without complications or bleeding.  -Discussed daily foot inspection. If there are any changes, to call the office immediately.  -Follow-up in 9 weeks or sooner if any problems are to arise. In the meantime, encouraged to call the office with any questions, concerns, changes symptoms.  Celesta Gentile, DPM

## 2016-10-18 DIAGNOSIS — E1165 Type 2 diabetes mellitus with hyperglycemia: Secondary | ICD-10-CM | POA: Diagnosis not present

## 2016-10-18 DIAGNOSIS — D519 Vitamin B12 deficiency anemia, unspecified: Secondary | ICD-10-CM | POA: Diagnosis not present

## 2016-10-18 DIAGNOSIS — E1140 Type 2 diabetes mellitus with diabetic neuropathy, unspecified: Secondary | ICD-10-CM | POA: Diagnosis not present

## 2016-10-18 DIAGNOSIS — I10 Essential (primary) hypertension: Secondary | ICD-10-CM | POA: Diagnosis not present

## 2016-10-24 DIAGNOSIS — I739 Peripheral vascular disease, unspecified: Secondary | ICD-10-CM | POA: Diagnosis not present

## 2016-10-24 DIAGNOSIS — E1140 Type 2 diabetes mellitus with diabetic neuropathy, unspecified: Secondary | ICD-10-CM | POA: Diagnosis not present

## 2016-10-24 DIAGNOSIS — M216X1 Other acquired deformities of right foot: Secondary | ICD-10-CM | POA: Diagnosis not present

## 2016-10-24 DIAGNOSIS — I10 Essential (primary) hypertension: Secondary | ICD-10-CM | POA: Diagnosis not present

## 2016-10-24 DIAGNOSIS — Z1389 Encounter for screening for other disorder: Secondary | ICD-10-CM | POA: Diagnosis not present

## 2016-12-20 ENCOUNTER — Encounter: Payer: Self-pay | Admitting: Podiatry

## 2016-12-20 ENCOUNTER — Ambulatory Visit (INDEPENDENT_AMBULATORY_CARE_PROVIDER_SITE_OTHER): Payer: Medicare Other | Admitting: Podiatry

## 2016-12-20 DIAGNOSIS — M79674 Pain in right toe(s): Secondary | ICD-10-CM | POA: Diagnosis not present

## 2016-12-20 DIAGNOSIS — L84 Corns and callosities: Secondary | ICD-10-CM | POA: Diagnosis not present

## 2016-12-20 DIAGNOSIS — M79675 Pain in left toe(s): Secondary | ICD-10-CM | POA: Diagnosis not present

## 2016-12-20 DIAGNOSIS — B351 Tinea unguium: Secondary | ICD-10-CM

## 2016-12-20 DIAGNOSIS — E1149 Type 2 diabetes mellitus with other diabetic neurological complication: Secondary | ICD-10-CM

## 2016-12-21 NOTE — Progress Notes (Signed)
Subjective: 61 y.o. returns the office today for painful, elongated, thickened toenails which he cannot trim himself as well as for calluses to his feet. Denies any redness or drainage around the nails. Denies any acute changes since last appointment and no new complaints today. Denies any systemic complaints such as fevers, chills, nausea, vomiting.   Objective: AAO 3, NAD DP/PT pulses palpable, CRT less than 3 seconds Decreased sensation with SWMF Nails hypertrophic, dystrophic, elongated, brittle, discolored 8. There is tenderness overlying the nails 1-5 on the left and 3-5 on the right. He states as they get long they rub in his shoes which make it difficult. His wife has to trim the nails at times. There is no surrounding erythema or drainage along the nail sites. Hyperkeratotic tissue along the left lateral foot and right medial foot. Upon debridement no underlying ulceration, drainage or any signs of infection. There is evidence of dried blood on the right foot on the callus where he was picking at the callus.  No open lesions or pre-ulcerative lesions are identified. Overall doing well without any significant change.  No other areas of tenderness bilateral lower extremities. No overlying edema, erythema, increased warmth. No pain with calf compression, swelling, warmth, erythema.  Assessment: Patient presents with symptomatic onychomycosis; pre-ulcerative callus  Plan: -Treatment options including alternatives, risks, complications were discussed -Nails sharply debrided 8 without complication/bleeding -Hyperkeratotic tissue debrided x 2 without complications or bleeding.  -Discussed daily foot inspection. If there are any changes, to call the office immediately.  -Follow-up in 9 weeks or sooner if any problems are to arise. In the meantime, encouraged to call the office with any questions, concerns, changes symptoms.  Celesta Gentile, DPM

## 2017-01-01 ENCOUNTER — Encounter: Payer: Self-pay | Admitting: Podiatry

## 2017-01-01 ENCOUNTER — Ambulatory Visit (INDEPENDENT_AMBULATORY_CARE_PROVIDER_SITE_OTHER): Payer: Medicare Other | Admitting: Podiatry

## 2017-01-01 VITALS — BP 118/77 | Temp 98.0°F

## 2017-01-01 DIAGNOSIS — L84 Corns and callosities: Secondary | ICD-10-CM

## 2017-01-01 DIAGNOSIS — L97511 Non-pressure chronic ulcer of other part of right foot limited to breakdown of skin: Secondary | ICD-10-CM

## 2017-01-01 MED ORDER — SILVER SULFADIAZINE 1 % EX CREA: 1.0000 "application " | TOPICAL_CREAM | Freq: Every day | CUTANEOUS | 0 refills | Status: AC

## 2017-01-02 NOTE — Progress Notes (Signed)
Subjective: Mr. Hopfensperger [place] op city [place] concerns of a spot on the bottom of the right foot which is been bleeding which is been ongoing for about 1 week. He denies stepping on any foreign objects and denies any recent injury. He is unsure how this started. PlexiPulse he denies any swelling or redness to his feet. He said no recent treatment for this. Denies any systemic complaints such as fevers, chills, nausea, vomiting. No acute changes since last appointment, and no other complaints at this time.   Objective: AAO x3, NAD DP/PT pulses palpable bilaterally, CRT less than 3 seconds Sensation decreased with Sims once the monofilament. 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 Submetatarsal 2/3 is a hyperkeratotic lesion. Upon debridement to severe to be an old blister which callused over. After debridement there is a superficial wound measuring 1.4 x 1 x 0.1 cm. There is no probing, undermining or tunneling. There is no surrounding erythema, ascending cellulitis. There is no flexor crepitus. There is no malodor. No other open lesions or pre-ulcerative lesions.  No pain with calf compression, swelling, warmth, erythema  Assessment: Recurrent ulceration right foot  Plan: -All treatment options discussed with the patient including all alternatives, risks, complications.  -Wound sharply debrided so without any competitions to healthy, granular tissue. Silvadene was applied and recommended Silvadene dressing changes daily. New surgical shoe was dispensed with offloading pads to help take pressure off the area. Is no conical signs of infections over hold off on antibiotics. Monitor for signs or symptoms of infection. Limit activity -Patient encouraged to call the office with any questions, concerns, change in symptoms.   Celesta Gentile, DPM

## 2017-01-08 ENCOUNTER — Ambulatory Visit: Payer: Medicare Other | Admitting: Podiatry

## 2017-01-17 ENCOUNTER — Ambulatory Visit (INDEPENDENT_AMBULATORY_CARE_PROVIDER_SITE_OTHER): Payer: Medicare Other | Admitting: Podiatry

## 2017-01-17 DIAGNOSIS — L97511 Non-pressure chronic ulcer of other part of right foot limited to breakdown of skin: Secondary | ICD-10-CM

## 2017-01-18 NOTE — Progress Notes (Signed)
Subjective: Mr. Rosenwald presents to the office today for follow-up evaluation of wound of the right foot. His wife is been applying Silvadene to the wound daily. She states that this has been helping quite a bit. She states the superficial and that "it just needs some covering to it". Denies any drainage or pus. Denies any surrounding redness or red streaks. Denies any systemic complaints such as fevers, chills, nausea, vomiting. No acute changes since last appointment, and no other complaints at this time.   Objective: AAO x3, NAD DP/PT pulses palpable bilaterally, CRT less than 3 seconds Sensation decreased with Sims once the monofilament. 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 Submetatarsal 2/3 is a hyperkeratotic lesion. There continues to be a wound measuring 0.8 x 0.7 cm in a superficial with a granular wound base. There is no swelling erythema, ascending cellulitis. There is no fluctuance or crepitus. There is no malodor. Is no clinical signs of infection present. No other open lesions or pre-ulcerative lesions.  No pain with calf compression, swelling, warmth, erythema  Assessment: Recurrent ulceration right foot  Plan: -All treatment options discussed with the patient including all alternatives, risks, complications.  -Wound sharply debrided so without any complications to healthy, granular tissue. Silvadene was applied and recommended she continue this daily. Continue offloading pads. -Monitor for any clinical signs or symptoms of infection and directed to call the office immediately should any occur or go to the ER. -RTC 2 weeks or sooner if needed.  Celesta Gentile, DPM

## 2017-02-01 ENCOUNTER — Ambulatory Visit (INDEPENDENT_AMBULATORY_CARE_PROVIDER_SITE_OTHER): Payer: Medicare Other | Admitting: Podiatry

## 2017-02-01 ENCOUNTER — Encounter: Payer: Self-pay | Admitting: Podiatry

## 2017-02-01 DIAGNOSIS — L97509 Non-pressure chronic ulcer of other part of unspecified foot with unspecified severity: Secondary | ICD-10-CM | POA: Diagnosis not present

## 2017-02-01 DIAGNOSIS — L97511 Non-pressure chronic ulcer of other part of right foot limited to breakdown of skin: Secondary | ICD-10-CM

## 2017-02-04 ENCOUNTER — Telehealth: Payer: Self-pay | Admitting: *Deleted

## 2017-02-04 NOTE — Telephone Encounter (Signed)
Dr. Jacqualyn Posey ordered Collagen for daily dressing changes to ulcer to right sub 2 and 3 metatarsals L97.511, measuring 1.0 x 0.5 x 0.1cm with low exudate. Orders faxed to [facility].

## 2017-02-04 NOTE — Progress Notes (Signed)
Subjective: Michael Cantu presents to the office today for follow-up evaluation of wound of the right foot. He states he has continue with Silvadene dressing changes daily. He denies any pus coming from the area but he does notice some bloody type drainage on the bandage when the dressing gets changed. He denies any swelling or redness to his feet he has no new concerns. He has no pain. Denies any systemic complaints such as fevers, chills, nausea, vomiting. No acute changes since last appointment, and no other complaints at this time.   Objective: AAO x3, NAD DP/PT pulses palpable bilaterally, CRT less than 3 seconds Sensation decreased with Simms Weinstein monofilament. 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 Submetatarsal 2/3 is a hyperkeratotic lesion. There continues to be a wound measuring 1 x 0.5 cm in a superficial with a granular wound base. There is no surrounding erythema, ascending cellulitis. There is no fluctuance or crepitus. There is no malodor. Is no clinical signs of infection present. No other open lesions or pre-ulcerative lesions.  No pain with calf compression, swelling, warmth, erythema  Assessment: Recurrent ulceration right foot  Plan: -All treatment options discussed with the patient including all alternatives, risks, complications.  -Wound sharply debrided so without any complications to healthy, granular tissue. Silvadene was applied and recommended she continue this daily. Continue offloading pads. Today I ordered a collagen, silver dressing to be applied daily. This is ordered today through [facility].  -Monitor for any clinical signs or symptoms of infection and directed to call the office immediately should any occur or go to the ER. -RTC 2 weeks or sooner if needed.  Celesta Gentile, DPM

## 2017-02-15 ENCOUNTER — Encounter: Payer: Self-pay | Admitting: Podiatry

## 2017-02-15 ENCOUNTER — Ambulatory Visit (INDEPENDENT_AMBULATORY_CARE_PROVIDER_SITE_OTHER): Payer: Medicare Other | Admitting: Podiatry

## 2017-02-15 DIAGNOSIS — M79674 Pain in right toe(s): Secondary | ICD-10-CM | POA: Diagnosis not present

## 2017-02-15 DIAGNOSIS — L97511 Non-pressure chronic ulcer of other part of right foot limited to breakdown of skin: Secondary | ICD-10-CM | POA: Diagnosis not present

## 2017-02-15 DIAGNOSIS — B351 Tinea unguium: Secondary | ICD-10-CM | POA: Diagnosis not present

## 2017-02-15 DIAGNOSIS — M79675 Pain in left toe(s): Secondary | ICD-10-CM | POA: Diagnosis not present

## 2017-02-20 NOTE — Progress Notes (Signed)
Subjective: Mr. Fangman presents to the office today for follow-up evaluation of wound of the right foot.he states he's been continuing the Silvadene dressing changes daily. Denies any redness or drainage or any swelling medial to the wound is somewhat better. He has a states his nails are thickened elongated and he cannot trim them himself. Denies any redness or drainage or any swelling to the toenail sites. He has no other concerns today. Denies any systemic complaints such as fevers, chills, nausea, vomiting. No acute changes since last appointment, and no other complaints at this time.   Objective: AAO x3, NAD DP/PT pulses palpable bilaterally, CRT less than 3 seconds Sensation decreased with Simms Weinstein monofilament. 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 Submetatarsal 2/3 is a hyperkeratotic lesion. There continues to be a wound measuring 0.9 x 0.5 cm in a superficial with a granular wound base. There is no surrounding erythema, ascending cellulitis. There is no fluctuance or crepitus. There is no malodor. There is no clinical signs of infection present. Nails appear to be hypertrophic, dystrophic, elongated 9. Right hallux has been amputated. Upon debridement the right fourth toenail came off easily without any drainage or pus or any underlying wound. No other open lesions or pre-ulcerative lesions.  No pain with calf compression, swelling, warmth, erythema  Assessment: Recurrent ulceration right foot; symptomatic onychomycosis  Plan: -All treatment options discussed with the patient including all alternatives, risks, complications.  -Wound sharply debrided so without any complications to healthy, granular tissue. Continue silvadene daily. Continue offloading. -Nail sharply debrided times nylon without any complications or bleeding. The right fourth toenail came off easily without any underlying wound. In the buttock ointment and a bandage daily. -Monitor for any  clinical signs or symptoms of infection and directed to call the office immediately should any occur or go to the ER. -RTC 2 weeks or sooner if needed.  Celesta Gentile, DPM

## 2017-03-01 ENCOUNTER — Encounter: Payer: Self-pay | Admitting: Podiatry

## 2017-03-01 ENCOUNTER — Ambulatory Visit (INDEPENDENT_AMBULATORY_CARE_PROVIDER_SITE_OTHER): Payer: Medicare Other | Admitting: Podiatry

## 2017-03-01 DIAGNOSIS — L97511 Non-pressure chronic ulcer of other part of right foot limited to breakdown of skin: Secondary | ICD-10-CM | POA: Diagnosis not present

## 2017-03-05 NOTE — Progress Notes (Signed)
Subjective: Mr. Spittler presents to the office today for follow-up evaluation of wound of the right foot. He states he has been having some small amount of bloody drainage to the wound denies any pus. Denies any surrounding redness or red streaks. His main of the surgical shoe. He states that his wife change the bandage but he is not sure which she's been putting on. He did fall scraping his knee but he denies any other injury denies any pain at this time from the fall. He has no other concerns today. Denies any systemic complaints such as fevers, chills, nausea, vomiting. No acute changes since last appointment, and no other complaints at this time.   Objective: AAO x3, NAD DP/PT pulses palpable bilaterally, CRT less than 3 seconds Sensation decreased with Simms Weinstein monofilament. 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 Submetatarsal 2/3 is a hyperkeratotic lesion. There continues to be a wound measuring 0.8 x 0.5 cm in a superficial with a granular wound base. There is no surrounding erythema, ascending cellulitis. There is no fluctuance or crepitus. There is no malodor. There is no clinical signs of infection present. No other open lesions or pre-ulcerative lesions.  No pain with calf compression, swelling, warmth, erythema  Assessment: Recurrent ulceration right foott  Plan: -All treatment options discussed with the patient including all alternatives, risks, complications.  -Wound sharply debrided so without any complications to healthy, granular tissue. Continue silvadene daily. Continue offloading. -Monitor for any clinical signs or symptoms of infection and directed to call the office immediately should any occur or go to the ER. -RTC 2 weeks or sooner if needed.  *x-ray next appointment with skin marker over the wound   Celesta Gentile, DPM

## 2017-03-15 ENCOUNTER — Encounter: Payer: Self-pay | Admitting: Podiatry

## 2017-03-15 ENCOUNTER — Ambulatory Visit: Payer: Medicare Other | Admitting: Podiatry

## 2017-03-15 ENCOUNTER — Ambulatory Visit (INDEPENDENT_AMBULATORY_CARE_PROVIDER_SITE_OTHER): Payer: Medicare Other

## 2017-03-15 ENCOUNTER — Telehealth: Payer: Self-pay | Admitting: *Deleted

## 2017-03-15 VITALS — BP 131/80 | HR 107

## 2017-03-15 DIAGNOSIS — L97511 Non-pressure chronic ulcer of other part of right foot limited to breakdown of skin: Secondary | ICD-10-CM

## 2017-03-15 DIAGNOSIS — M898X9 Other specified disorders of bone, unspecified site: Secondary | ICD-10-CM

## 2017-03-15 NOTE — Patient Instructions (Signed)

## 2017-03-15 NOTE — Telephone Encounter (Signed)
"  I was there a few hours ago.  I scheduled surgery.  I want to confirm when I scheduled the surgery."  You scheduled it for November 28.  "Okay, good I was trying to tell my wife and she had said it can't be for next week because it wouldn't have been enough notice.  Thanks, that's all I needed to know."

## 2017-03-20 ENCOUNTER — Telehealth: Payer: Self-pay | Admitting: *Deleted

## 2017-03-20 NOTE — Telephone Encounter (Signed)
"  I calling because Dr. Jacqualyn Posey scheduled me for foot surgery on the 28th.  I was looking at my chart from [facility] and it shows I have a return post-op appointment with you, with Dr. Jacqualyn Posey on the 10th.  I've got a question about whether or not I will be able to drive to the foot center that day.  My wife has to work that day and will not be able to drive me.  If you would please give me a call back."  I am returning your call.  Your first post-op visit is scheduled for December 3.  Dr. Jacqualyn Posey may release you to drive after that appointment.  So, you can ask him at that visit if it will be okay for you to drive.  "Okay, I can do that.  Thanks for calling me back."

## 2017-03-20 NOTE — Progress Notes (Signed)
Subjective: Mr. Wingerter presents to the office today for follow-up evaluation of wound of the right foot.  He states his wife is been continuing daily dressing changes.  He states he notes some occasional bleeding but denies any pus.  He denies any swelling or redness to his feet.  This is been ongoing now for the last several weeks without any significant improvement.  He denies any recent injury or trauma.  He has no other concerns today. Denies any systemic complaints such as fevers, chills, nausea, vomiting. No acute changes since last appointment, and no other complaints at this time.   Objective: AAO x3, NAD DP/PT pulses palpable bilaterally, CRT less than 3 seconds Sensation decreased with Simms Weinstein monofilament. 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 Submetatarsal 2/3 is a hyperkeratotic lesion. There continues to be a wound measuring 0.8 x 0.5 cm in a superficial with a granular wound base.  Overall the wound is the same size.  There is no surrounding erythema, ascending cellulitis. There is no fluctuance or crepitus. There is no malodor. There is no clinical signs of infection present. No other open lesions or pre-ulcerative lesions.  No pain with calf compression, swelling, warmth, erythema  Assessment: Recurrent ulceration right foot  Plan: -All treatment options discussed with the patient including all alternatives, risks, complications.  -X-rays were obtained and reviewed.  A skin marker was utilized to identify the area of the wound.  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 -We will sharply debrided with a scalpel down to healthy, granular tissue after verbal consent was obtained.  Recommend continue with daily dressing changes. -At this point since the wound is been nonhealing over the last several appointments were in the improvement I discussed surgical intervention to  remove portion of the second metatarsal.  I discussed the surgery as well as the postoperative course and he understands that she wished to go ahead and proceed with this and hope to try to get the wound to heal. -The incision placement as well as the postoperative course was discussed with the patient. I discussed risks of the surgery which include, but not limited to, infection, bleeding, pain, swelling, need for further surgery, delayed or nonhealing, painful or ugly scar, numbness or sensation changes, over/under correction, recurrence, transfer lesions, further deformity, hardware failure, DVT/PE, loss of toe/foot. Patient understands these risks and wishes to proceed with surgery. The surgical consent was reviewed with the patient all 3 pages were signed. No promises or guarantees were given to the outcome of the procedure. All questions were answered to the best of my ability. Before the surgery the patient was encouraged to call the office if there is any further questions. The surgery will be performed at the [facility] on an outpatient basis.  Trula Slade DPM

## 2017-03-25 ENCOUNTER — Ambulatory Visit: Payer: Medicare Other | Admitting: Podiatry

## 2017-03-27 ENCOUNTER — Encounter: Payer: Self-pay | Admitting: Podiatry

## 2017-03-27 DIAGNOSIS — M25774 Osteophyte, right foot: Secondary | ICD-10-CM | POA: Diagnosis not present

## 2017-03-27 DIAGNOSIS — M79671 Pain in right foot: Secondary | ICD-10-CM | POA: Diagnosis not present

## 2017-03-27 DIAGNOSIS — M1380 Other specified arthritis, unspecified site: Secondary | ICD-10-CM | POA: Diagnosis not present

## 2017-03-27 DIAGNOSIS — D1631 Benign neoplasm of short bones of right lower limb: Secondary | ICD-10-CM | POA: Diagnosis not present

## 2017-03-27 NOTE — Progress Notes (Signed)
Pre-operative Note  Patient presents to the [facility] today for surgical intervention of the RIGHT foot for 2nd metatarsal head excision. The surgical consent was reviewed with the patient and we discussed the procedure as well as the postoperative course. I again discussed all alternatives, risks, complications. I answered all of their questions to the best of my ability and they wish to proceed with surgery. No promises or guarantees were given as to the outcome of the surgery.   The surgical consent was signed.   Patient is NPO since midnight.  The patient does not have have a history of blood clots or bleeding disorders.   No further questions.   Celesta Gentile, [place]

## 2017-03-28 ENCOUNTER — Telehealth: Payer: Self-pay | Admitting: *Deleted

## 2017-03-28 NOTE — Telephone Encounter (Signed)
error 

## 2017-03-28 NOTE — Telephone Encounter (Signed)
Called patient to check in after the patient had surgery yesterday and the patient stated he was fine and that he had no issues and that he would see [place] tomorrow morning and I stated if he had questions or concerns to call the [place] office. Lattie Haw

## 2017-03-29 ENCOUNTER — Encounter: Payer: Self-pay | Admitting: Podiatry

## 2017-03-29 ENCOUNTER — Ambulatory Visit (INDEPENDENT_AMBULATORY_CARE_PROVIDER_SITE_OTHER): Payer: Medicare Other

## 2017-03-29 ENCOUNTER — Ambulatory Visit (INDEPENDENT_AMBULATORY_CARE_PROVIDER_SITE_OTHER): Payer: Medicare Other | Admitting: Podiatry

## 2017-03-29 VITALS — BP 127/59 | HR 95 | Resp 16

## 2017-03-29 DIAGNOSIS — L97511 Non-pressure chronic ulcer of other part of right foot limited to breakdown of skin: Secondary | ICD-10-CM

## 2017-03-29 DIAGNOSIS — M898X9 Other specified disorders of bone, unspecified site: Secondary | ICD-10-CM

## 2017-04-01 ENCOUNTER — Encounter: Payer: Medicare Other | Admitting: Podiatry

## 2017-04-01 NOTE — Progress Notes (Signed)
Subjective: Michael Cantu is a 61 y.o. is seen today in office s/p right 2nd metatarsal excicsion preformed on 03/27/2017.  He states he is doing well not having any pain.  He presents today for postop visit #1.  He has been wearing a surgical shoe address the office but is much as possible.  He presents today with his wife.. Denies any systemic complaints such as fevers, chills, nausea, vomiting. No calf pain, chest pain, shortness of breath.   Objective: General: No acute distress, AAOx3  DP/PT pulses palpable 2/4, CRT < 3 sec to all digits.  Protective sensation intact. Motor function intact.  Right foot: Incision is well coapted without any evidence of dehiscence and sutures are intact dorsally. There is no surrounding erythema, ascending cellulitis, fluctuance, crepitus, malodor, drainage/purulence. There is minimal edema around the surgical site. There is no pain along the surgical site.  Wound submetatarsal to area measures 1 x 0.6 cm and is superficial with a granular wound base.  There is no probing, undermining or tunneling.  No drainage or pus.  There is no clinical signs of infection noted. No other areas of tenderness to bilateral lower extremities.  No other open lesions or pre-ulcerative lesions.  No pain with calf compression, swelling, warmth, erythema.   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  No evidence of acute fracture.  Status post partial second metatarsal resection -Patient was painted over the incision as well as the wound and a dry sterile dressing.  Discussed that his wife can change the bandage every other day if needed. -Continue surgical shoe and limit weightbearing -Elevation -Pain medication as needed. -Monitor for any clinical signs or  symptoms of infection and DVT/PE and directed to call the office immediately should any occur or go to the ER. -Follow-up as scheduled or sooner if any problems arise. In the meantime, encouraged to call the office with any questions, concerns, change in symptoms.   Celesta Gentile, DPM

## 2017-04-05 ENCOUNTER — Encounter: Payer: Self-pay | Admitting: Podiatry

## 2017-04-05 ENCOUNTER — Ambulatory Visit (INDEPENDENT_AMBULATORY_CARE_PROVIDER_SITE_OTHER): Payer: Medicare Other | Admitting: Podiatry

## 2017-04-05 DIAGNOSIS — L97511 Non-pressure chronic ulcer of other part of right foot limited to breakdown of skin: Secondary | ICD-10-CM

## 2017-04-05 DIAGNOSIS — M898X9 Other specified disorders of bone, unspecified site: Secondary | ICD-10-CM

## 2017-04-08 ENCOUNTER — Encounter: Payer: Medicare Other | Admitting: Podiatry

## 2017-04-09 NOTE — Progress Notes (Signed)
Subjective: Michael Cantu is a 61 y.o. is seen today in office s/p right 2nd metatarsal excicsion preformed on 03/27/2017.Marland Kitchen  He presents today with his wife and they both state that he is doing well.  His wife does change the bandage of antibiotic ointment.  He does admit that he has been driving and he has been more active on his foot than what he should be.  He has no pain.  He has no other concerns today.  He has been wearing the surgical shoe. Denies any systemic complaints such as fevers, chills, nausea, vomiting. No calf pain, chest pain, shortness of breath.   Objective: General: No acute distress, AAOx3  DP/PT pulses palpable 2/4, CRT < 3 sec to all digits.  Protective sensation intact. Motor function intact.  Right foot: Incision is well coapted without any evidence of dehiscence and sutures are intact dorsally.  There is mild movement across the incision site.  There is no drainage or pus expressed.  The wound submetatarsal to area appears to be filling in and measures approximately 0.6 x 0.5 cm and is superficial with a granular wound base.  There is no probing, undermining or tunneling.  There is no surrounding erythema, ascending cellulitis.  There is no fluctuation or crepitation.  There is no malodor.  No clinical signs of infection are noted today.  There is mild motion across the incisions therefore left the sutures intact.  Antibiotic ointment No other areas of tenderness to bilateral lower extremities.  No other open lesions or pre-ulcerative lesions.  No pain with calf compression, swelling, warmth, erythema.   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  His wife continues the pain is daily.  Continue in surgical shoe and limited  weightbearing.  Elevation. -Monitor for any clinical signs or symptoms of infection and directed to call the office immediately should any occur or go to the ER. -Follow-up in 1 week or sooner if any issues are to arise.  Celesta Gentile, DPM

## 2017-04-12 ENCOUNTER — Ambulatory Visit (INDEPENDENT_AMBULATORY_CARE_PROVIDER_SITE_OTHER): Payer: Medicare Other | Admitting: Podiatry

## 2017-04-12 ENCOUNTER — Encounter: Payer: Self-pay | Admitting: Podiatry

## 2017-04-12 DIAGNOSIS — M898X9 Other specified disorders of bone, unspecified site: Secondary | ICD-10-CM

## 2017-04-12 DIAGNOSIS — L97511 Non-pressure chronic ulcer of other part of right foot limited to breakdown of skin: Secondary | ICD-10-CM

## 2017-04-15 NOTE — Progress Notes (Signed)
Subjective: Michael Cantu is a 61 y.o. is seen today in office s/p right 2nd metatarsal excicsion preformed on 03/27/2017.  He presents today for suture removal.  He has been wearing the surgical shoe.  His wife changes the bandage every day denies seeing any drainage or pus coming from the area he denies any increase in swelling or redness.  He has no new concerns today. Denies any systemic complaints such as fevers, chills, nausea, vomiting. No calf pain, chest pain, shortness of breath.   Objective: General: No acute distress, AAOx3  DP/PT pulses palpable 2/4, CRT < 3 sec to all digits.  Protective sensation intact. Motor function intact.  Right foot: Incision is well coapted without any evidence of dehiscence and sutures are intact dorsally to have the incision but the other half the sutures have come out.  Overall incision appears to be healing well.  On the plantar aspect the foot submetatarsal 2 areas of hyperkeratotic lesion of the central wound.  Very small amount of clear drainage expressed but there is no pus.  There is no fluctuation or crepitation.  After debridement of the wound there is no further drainage there is no probing, undermining or tunneling.  There is no surrounding erythema, ascending sialitis.  There is no fluctuation or crepitation.  There is no malodor.  After debridement the wound measures 0.4 x 0.4 cm and is superficial. No other areas of tenderness to bilateral lower extremities.  No other open lesions or pre-ulcerative lesions.  No pain with calf compression, swelling, warmth, erythema.   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  After removal the incision remains well coapted.  Antibiotic ointment was applied. -Sharply debrided the wound today without any  complications or bleeding with a scalpel to healthy, granular tissue. -Continue offloading at all times -Elevation, limit weightbearing -Surgical shoe -Monitor for any clinical signs or symptoms of infection and directed to call the office immediately should any occur or go to the ER. -Follow-up as scheduled or sooner if any issues are to arise.  Michael Cantu, DPM

## 2017-04-16 DIAGNOSIS — E1140 Type 2 diabetes mellitus with diabetic neuropathy, unspecified: Secondary | ICD-10-CM | POA: Diagnosis not present

## 2017-04-16 DIAGNOSIS — E1165 Type 2 diabetes mellitus with hyperglycemia: Secondary | ICD-10-CM | POA: Diagnosis not present

## 2017-04-18 DIAGNOSIS — E1140 Type 2 diabetes mellitus with diabetic neuropathy, unspecified: Secondary | ICD-10-CM | POA: Diagnosis not present

## 2017-04-18 DIAGNOSIS — M216X1 Other acquired deformities of right foot: Secondary | ICD-10-CM | POA: Diagnosis not present

## 2017-04-18 DIAGNOSIS — Z23 Encounter for immunization: Secondary | ICD-10-CM | POA: Diagnosis not present

## 2017-04-18 DIAGNOSIS — I10 Essential (primary) hypertension: Secondary | ICD-10-CM | POA: Diagnosis not present

## 2017-04-18 DIAGNOSIS — I739 Peripheral vascular disease, unspecified: Secondary | ICD-10-CM | POA: Diagnosis not present

## 2017-04-19 ENCOUNTER — Encounter: Payer: Self-pay | Admitting: Podiatry

## 2017-04-19 ENCOUNTER — Ambulatory Visit (INDEPENDENT_AMBULATORY_CARE_PROVIDER_SITE_OTHER): Payer: Medicare Other | Admitting: Podiatry

## 2017-04-19 DIAGNOSIS — M898X9 Other specified disorders of bone, unspecified site: Secondary | ICD-10-CM

## 2017-04-19 DIAGNOSIS — L97511 Non-pressure chronic ulcer of other part of right foot limited to breakdown of skin: Secondary | ICD-10-CM

## 2017-04-24 NOTE — Progress Notes (Signed)
Subjective: JOYCE LECKEY is a 61 y.o. is seen today in office s/p right 2nd metatarsal excicsion preformed on 03/27/2017.  He presents for follow-up evaluation today of the wound.  He states his wife is been changing the bandages daily with antibiotic ointment.  He denies any drainage or pus or increase in swelling or redness to his feet.  He denies any pain.  He has no new concerns today. Denies any systemic complaints such as fevers, chills, nausea, vomiting. No calf pain, chest pain, shortness of breath.   Objective: General: No acute distress, AAOx3  DP/PT pulses palpable 2/4, CRT < 3 sec to all digits.  Protective sensation intact. Motor function intact.  Right foot: Incision appears to be healing well.  Small granular tissue present within the incision site but this is superficial there is no probing, undermining or tunneling.  Almost appears that there is a scab that is formed that I come off.  On the plantar aspect submetatarsal 2 continues to be a superficial wound today measuring 0.2 x 0.2 cm and is superficial with a granular wound base.  There is no probing, undermining or tunneling.  There is no surrounding edema, erythema, increase in warmth.  There is no fluctuation or crepitation.  There is no malodor.  There is no clinical signs of infection noted. No other areas of tenderness to bilateral lower extremities.  No other open lesions or pre-ulcerative lesions.  No pain with calf compression, swelling, warmth, erythema.   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  Continue antibiotic ointment and a dressing daily.  I did sharply debride the wound to the submetatarsal to area today without any complications after verbal consent was obtained with a scalpel. Continue offloading at all times.  Elevation.  Limit  weightbearing. -Monitor for any clinical signs or symptoms of infection and directed to call the office immediately should any occur or go to the ER. -Follow-up as scheduled or sooner if needed.  He has no further questions or concerns today and agrees this plan.  Trula Slade DPM

## 2017-05-02 ENCOUNTER — Ambulatory Visit (INDEPENDENT_AMBULATORY_CARE_PROVIDER_SITE_OTHER): Payer: Medicare Other | Admitting: Podiatry

## 2017-05-02 ENCOUNTER — Encounter: Payer: Self-pay | Admitting: Podiatry

## 2017-05-02 DIAGNOSIS — B351 Tinea unguium: Secondary | ICD-10-CM

## 2017-05-02 DIAGNOSIS — M79675 Pain in left toe(s): Secondary | ICD-10-CM | POA: Diagnosis not present

## 2017-05-02 DIAGNOSIS — E1149 Type 2 diabetes mellitus with other diabetic neurological complication: Secondary | ICD-10-CM

## 2017-05-02 DIAGNOSIS — M79674 Pain in right toe(s): Secondary | ICD-10-CM

## 2017-05-02 DIAGNOSIS — L84 Corns and callosities: Secondary | ICD-10-CM | POA: Diagnosis not present

## 2017-05-02 DIAGNOSIS — L97511 Non-pressure chronic ulcer of other part of right foot limited to breakdown of skin: Secondary | ICD-10-CM

## 2017-05-03 NOTE — Progress Notes (Signed)
Subjective: Michael Cantu is a 62 y.o. is seen today in office s/p right 2nd metatarsal excicsion preformed on 03/27/2017.  Overall he states he is doing well.  He feels that the wounds of healed.  Denies any drainage or pus and denies any swelling or redness to his feet.  His wife is been changing the bandage daily.  He is returned to regular shoe.  This morning he does state that he did fall but he did not injure anything he has no lower extremity pain or swelling.  No bruising. Denies any systemic complaints such as fevers, chills, nausea, vomiting. No calf pain, chest pain, shortness of breath.   Objective: General: No acute distress, AAOx3  DP/PT pulses palpable 2/4, CRT < 3 sec to all digits.  Protective sensation intact. Motor function intact.  Right foot: Incision appears to be healing well.  On the incision there is a scab present.  Upon debridement the underlying skin appears to be healed.  The metatarsal to area.  The previous wound is hyperkeratotic lesion.  Upon debridement the wound again appears to be healed.  There is no drainage or pus there is no surrounding erythema, edema.  There is no clinical signs of infection present.  No fluctuance or crepitus. No other areas of tenderness identified to bilateral lower extremities. Hyperkeratotic tissue present to the medial aspect of the right foot along the area the previous imitation site.  Upon debridement there is no underlying ulceration drainage or signs of infection present. Nails are hypertrophic, dystrophic, discolored x8.  Tenderness nails 1-5 on the left and 3 through 5 on the right. No other open lesions or pre-ulcerative lesions.  No pain with calf compression, swelling, warmth, erythema.   Assessment and Plan:  Status post right second metatarsal excision with healed ulceration; symptomatic onychomycosis with pre-ulcerative callus  -Treatment options discussed including all alternatives, risks, and complications -Incision  of the wound appear to be healed today.  I debrided the scab from the incision site and this appears to be healed.  There is no signs of infection.  We will discharge him in the postoperative course. He agrees this plan. -Nails sharp debrided x8 without any complications or bleeding -Hyperkeratotic lesion Sharpy debrided x1 without any complications or bleeding -Monitor for any clinical signs or symptoms of infection and directed to call the office immediately should any occur or go to the ER. -Follow-up in 1 weeks or sooner if needed.  Call any questions or concerns.  Trula Slade DPM

## 2017-05-31 NOTE — Progress Notes (Signed)
DOS 03/27/17 Rt foot excision of 2nd metatarsal, partial to help reduce pressure to get ulcer heal

## 2017-06-18 DIAGNOSIS — J209 Acute bronchitis, unspecified: Secondary | ICD-10-CM | POA: Diagnosis not present

## 2017-07-04 ENCOUNTER — Ambulatory Visit (INDEPENDENT_AMBULATORY_CARE_PROVIDER_SITE_OTHER): Payer: Medicare Other | Admitting: Podiatry

## 2017-07-04 ENCOUNTER — Encounter: Payer: Self-pay | Admitting: Podiatry

## 2017-07-04 DIAGNOSIS — L84 Corns and callosities: Secondary | ICD-10-CM | POA: Diagnosis not present

## 2017-07-04 DIAGNOSIS — E1149 Type 2 diabetes mellitus with other diabetic neurological complication: Secondary | ICD-10-CM | POA: Diagnosis not present

## 2017-07-04 DIAGNOSIS — B351 Tinea unguium: Secondary | ICD-10-CM | POA: Diagnosis not present

## 2017-07-04 DIAGNOSIS — M79674 Pain in right toe(s): Secondary | ICD-10-CM | POA: Diagnosis not present

## 2017-07-04 DIAGNOSIS — M79675 Pain in left toe(s): Secondary | ICD-10-CM

## 2017-07-07 NOTE — Progress Notes (Signed)
Subjective: 62 y.o. returns the office today for painful, elongated, thickened toenails which he cannot trim himself as well as for calluses to both of his feet. Denies any redness or drainage around the nails or calluses. He states the wound has healed and he has not noticed any new ulceration form or any skin changes.  Denies any acute changes since last appointment and no new complaints today. Denies any systemic complaints such as fevers, chills, nausea, vomiting.   Objective: AAO 3, NAD DP/PT pulses palpable, CRT less than 3 seconds Decreased sensation with SWMF Nails hypertrophic, dystrophic, elongated, brittle, discolored 8. There is tenderness overlying the nails 1-5 on the left and 3-5 on the right. He states as they get long they rub in his shoes which make it difficult. His wife has to trim the nails at times. There is no surrounding erythema or drainage along the nail sites. Hyperkeratotic tissue along the left lateral foot and right medial foot. Upon debridement no underlying ulceration, drainage or any signs of infection. There is evidence of dried blood on the right foot on the callus where he was picking at the callus.  No open lesions or pre-ulcerative lesions are identified. Overall doing well without any significant change.  No other areas of tenderness bilateral lower extremities. No overlying edema, erythema, increased warmth. No pain with calf compression, swelling, warmth, erythema.  Assessment: Patient presents with symptomatic onychomycosis; pre-ulcerative callus  Plan: -Treatment options including alternatives, risks, complications were discussed -Nails sharply debrided 8 without complication/bleeding -Hyperkeratotic tissue debrided x 2 without complications or bleeding.  -Discussed daily foot inspection. If there are any changes, to call the office immediately.  -Follow-up in 9 weeks or sooner if any problems are to arise. In the meantime, encouraged to call the  office with any questions, concerns, changes symptoms.  Celesta Gentile, DPM

## 2017-07-12 DIAGNOSIS — D519 Vitamin B12 deficiency anemia, unspecified: Secondary | ICD-10-CM | POA: Diagnosis not present

## 2017-07-12 DIAGNOSIS — E1140 Type 2 diabetes mellitus with diabetic neuropathy, unspecified: Secondary | ICD-10-CM | POA: Diagnosis not present

## 2017-07-12 DIAGNOSIS — I10 Essential (primary) hypertension: Secondary | ICD-10-CM | POA: Diagnosis not present

## 2017-07-12 DIAGNOSIS — E1165 Type 2 diabetes mellitus with hyperglycemia: Secondary | ICD-10-CM | POA: Diagnosis not present

## 2017-07-16 DIAGNOSIS — M216X1 Other acquired deformities of right foot: Secondary | ICD-10-CM | POA: Diagnosis not present

## 2017-07-16 DIAGNOSIS — I10 Essential (primary) hypertension: Secondary | ICD-10-CM | POA: Diagnosis not present

## 2017-07-16 DIAGNOSIS — E1140 Type 2 diabetes mellitus with diabetic neuropathy, unspecified: Secondary | ICD-10-CM | POA: Diagnosis not present

## 2017-07-16 DIAGNOSIS — L409 Psoriasis, unspecified: Secondary | ICD-10-CM | POA: Diagnosis not present

## 2017-07-16 DIAGNOSIS — I739 Peripheral vascular disease, unspecified: Secondary | ICD-10-CM | POA: Diagnosis not present

## 2017-09-05 ENCOUNTER — Ambulatory Visit: Payer: Medicare Other | Admitting: Podiatry

## 2017-09-05 ENCOUNTER — Encounter: Payer: Self-pay | Admitting: Podiatry

## 2017-09-05 DIAGNOSIS — B351 Tinea unguium: Secondary | ICD-10-CM

## 2017-09-05 DIAGNOSIS — E1149 Type 2 diabetes mellitus with other diabetic neurological complication: Secondary | ICD-10-CM | POA: Diagnosis not present

## 2017-09-05 DIAGNOSIS — L84 Corns and callosities: Secondary | ICD-10-CM

## 2017-09-05 DIAGNOSIS — M79674 Pain in right toe(s): Secondary | ICD-10-CM | POA: Diagnosis not present

## 2017-09-05 DIAGNOSIS — M79675 Pain in left toe(s): Secondary | ICD-10-CM

## 2017-09-08 NOTE — Progress Notes (Signed)
Subjective: 62 y.o. returns the office today for painful, elongated, thickened toenails which he cannot trim himself as well as for calluses to both of his feet. Denies any redness or drainage around the nails or calluses. Denies any acute changes since last appointment and no new complaints today. Denies any systemic complaints such as fevers, chills, nausea, vomiting.   Objective: AAO 3, NAD DP/PT pulses palpable, CRT less than 3 seconds Decreased sensation with SWMF Nails hypertrophic, dystrophic, elongated, brittle, discolored 8. There is tenderness overlying the nails 1-5 on the left and 3-5 on the right.  He reports irritation to the toenail sites particular with shoes and pressure.  There is no surrounding redness or drainage or any signs of infection today. Hyperkeratotic tissue along the left lateral foot and right medial foot. Upon debridement no underlying ulceration, drainage or any signs of infection. There is evidence of dried blood on the right foot on the callus where he was picking at the callus.  No open lesions or pre-ulcerative lesions are identified. Overall doing well without any significant change.  No other areas of tenderness bilateral lower extremities. No overlying edema, erythema, increased warmth. No pain with calf compression, swelling, warmth, erythema.  Assessment: Patient presents with symptomatic onychomycosis; pre-ulcerative callus  Plan: -Treatment options including alternatives, risks, complications were discussed -Nails sharply debrided 8 without complication/bleeding -Hyperkeratotic tissue debrided x 2 without complications or bleeding.  -Discussed daily foot inspection. If there are any changes, to call the office immediately.  -Follow-up in 9 weeks or sooner if any problems are to arise. In the meantime, encouraged to call the office with any questions, concerns, changes symptoms.  Celesta Gentile, DPM

## 2017-11-05 ENCOUNTER — Encounter: Payer: Self-pay | Admitting: Podiatry

## 2017-11-05 ENCOUNTER — Ambulatory Visit: Payer: Medicare Other | Admitting: Podiatry

## 2017-11-05 DIAGNOSIS — M79675 Pain in left toe(s): Secondary | ICD-10-CM

## 2017-11-05 DIAGNOSIS — Q828 Other specified congenital malformations of skin: Secondary | ICD-10-CM

## 2017-11-05 DIAGNOSIS — M79674 Pain in right toe(s): Secondary | ICD-10-CM | POA: Diagnosis not present

## 2017-11-05 DIAGNOSIS — E1149 Type 2 diabetes mellitus with other diabetic neurological complication: Secondary | ICD-10-CM | POA: Diagnosis not present

## 2017-11-05 DIAGNOSIS — B351 Tinea unguium: Secondary | ICD-10-CM | POA: Diagnosis not present

## 2017-11-06 NOTE — Progress Notes (Signed)
Subjective: 62 y.o. returns the office today for painful, elongated, thickened toenails which he cannot trim himself as well as for calluses to both of his feet. Denies any redness or drainage around the nails or calluses. Denies any acute changes since last appointment and no new complaints today. Denies any systemic complaints such as fevers, chills, nausea, vomiting.   Objective: AAO 3, NAD DP/PT pulses palpable, CRT less than 3 seconds Decreased sensation with SWMF Nails hypertrophic, dystrophic, elongated, brittle, discolored 8. There is tenderness overlying the nails 1-5 on the left and 3-5 on the right.  He reports irritation to the toenail sites particular with shoes and pressure.  There is no surrounding redness or drainage or any signs of infection today. Hyperkeratotic tissue along the left lateral foot and right medial foot. Upon debridement no underlying ulceration, drainage or any signs of infection. There is evidence of dried blood on the right foot on the callus where he was picking at the callus.  No open lesions or pre-ulcerative lesions are identified. No pain with calf compression, swelling, warmth, erythema.  Assessment: Patient presents with symptomatic onychomycosis; pre-ulcerative callus  Plan: -Treatment options including alternatives, risks, complications were discussed -Nails sharply debrided 8 without complication/bleeding -Hyperkeratotic tissue debrided x 2 without complications or bleeding.  -Discussed daily foot inspection. If there are any changes, to call the office immediately.  -Follow-up in 9 weeks or sooner if any problems are to arise. In the meantime, encouraged to call the office with any questions, concerns, changes symptoms.  Celesta Gentile, DPM

## 2017-11-13 DIAGNOSIS — Z0001 Encounter for general adult medical examination with abnormal findings: Secondary | ICD-10-CM | POA: Diagnosis not present

## 2017-12-24 ENCOUNTER — Encounter: Payer: Self-pay | Admitting: Podiatry

## 2017-12-24 ENCOUNTER — Ambulatory Visit: Payer: Medicare Other | Admitting: Podiatry

## 2017-12-24 VITALS — Temp 97.3°F

## 2017-12-24 DIAGNOSIS — L84 Corns and callosities: Secondary | ICD-10-CM

## 2017-12-24 DIAGNOSIS — M2042 Other hammer toe(s) (acquired), left foot: Secondary | ICD-10-CM

## 2017-12-24 DIAGNOSIS — M2041 Other hammer toe(s) (acquired), right foot: Secondary | ICD-10-CM | POA: Diagnosis not present

## 2017-12-24 MED ORDER — MUPIROCIN 2 % EX OINT: 1.0000 "application " | TOPICAL_OINTMENT | Freq: Two times a day (BID) | CUTANEOUS | 2 refills | Status: AC

## 2017-12-28 NOTE — Progress Notes (Signed)
Subjective: Michael Cantu presents to the office today for concerns of a blister wound to the right second toe.  His wife has been keeping the area clean and keep antibiotic ointment on it has gotten better but she was to have the area checked.  Denies any drainage or pus coming from the area.  He also has a very thick callus over the previous limitations of the right foot.  Bactrim today.  Otherwise he is doing well. Denies any systemic complaints such as fevers, chills, nausea, vomiting. No acute changes since last appointment, and no other complaints at this time.   Objective: AAO x3, NAD DP/PT pulses palpable bilaterally, CRT less than 3 seconds Extensive flexion contractures questionable toes tender dorsal right second toe there is a small superficial scab present that could have been an old blister.  There is no tenderness to this area.  There is no open sores no drainage or pus there is no swelling or redness identified.  Thick hyperkeratotic lesion along the previous education site of the first ray.  Upon debridement there is no underlying ulceration, drainage or any signs of infection noted today.  No open lesions or pre-ulcerative lesions.  No pain with calf compression, swelling, warmth, erythema  Assessment: Pre-ulcerative area right second toe with hyperkeratotic lesion right foot  Plan: -All treatment options discussed with the patient including all alternatives, risks, complications.  -Regards to the right second toe dispensed offloading pads to keep a small amount of antibiotic ointment and a bandage on daily.  Monitoring signs or symptoms of infection.  As a courtesy I debrided the hyperkeratotic lesion on the right foot without any complications or bleeding today. -Patient encouraged to call the office with any questions, concerns, change in symptoms.  -Follow-up as scheduled for routine care sooner if needed if there is any worsening.  Trula Slade DPM

## 2018-01-17 DIAGNOSIS — I10 Essential (primary) hypertension: Secondary | ICD-10-CM | POA: Diagnosis not present

## 2018-01-17 DIAGNOSIS — E1140 Type 2 diabetes mellitus with diabetic neuropathy, unspecified: Secondary | ICD-10-CM | POA: Diagnosis not present

## 2018-01-17 DIAGNOSIS — E1165 Type 2 diabetes mellitus with hyperglycemia: Secondary | ICD-10-CM | POA: Diagnosis not present

## 2018-01-20 DIAGNOSIS — I10 Essential (primary) hypertension: Secondary | ICD-10-CM | POA: Diagnosis not present

## 2018-01-20 DIAGNOSIS — Z23 Encounter for immunization: Secondary | ICD-10-CM | POA: Diagnosis not present

## 2018-01-20 DIAGNOSIS — E1140 Type 2 diabetes mellitus with diabetic neuropathy, unspecified: Secondary | ICD-10-CM | POA: Diagnosis not present

## 2018-01-20 DIAGNOSIS — E782 Mixed hyperlipidemia: Secondary | ICD-10-CM | POA: Diagnosis not present

## 2018-01-20 DIAGNOSIS — D519 Vitamin B12 deficiency anemia, unspecified: Secondary | ICD-10-CM | POA: Diagnosis not present

## 2018-01-20 DIAGNOSIS — I739 Peripheral vascular disease, unspecified: Secondary | ICD-10-CM | POA: Diagnosis not present

## 2018-02-04 ENCOUNTER — Ambulatory Visit: Payer: Medicare Other | Admitting: Podiatry

## 2018-02-04 DIAGNOSIS — M79675 Pain in left toe(s): Secondary | ICD-10-CM | POA: Diagnosis not present

## 2018-02-04 DIAGNOSIS — E1149 Type 2 diabetes mellitus with other diabetic neurological complication: Secondary | ICD-10-CM

## 2018-02-04 DIAGNOSIS — L84 Corns and callosities: Secondary | ICD-10-CM | POA: Diagnosis not present

## 2018-02-04 DIAGNOSIS — L97511 Non-pressure chronic ulcer of other part of right foot limited to breakdown of skin: Secondary | ICD-10-CM

## 2018-02-04 DIAGNOSIS — M79674 Pain in right toe(s): Secondary | ICD-10-CM

## 2018-02-04 DIAGNOSIS — B351 Tinea unguium: Secondary | ICD-10-CM

## 2018-02-11 NOTE — Progress Notes (Signed)
Subjective: 62 y.o. returns the office today for painful, elongated, thickened toenails which he cannot trim himself as well as for calluses to both of his feet and for wound on the right second toe. Denies any redness or drainage around the nails or calluses. Denies any acute changes since last appointment and no new complaints today. Denies any systemic complaints such as fevers, chills, nausea, vomiting.   Objective: AAO 3, NAD DP/PT pulses palpable, CRT less than 3 seconds Decreased sensation with SWMF Nails hypertrophic, dystrophic, elongated, brittle, discolored 8. There is tenderness overlying the nails 1-5 on the left and 3-5 on the right.   Hyperkeratotic tissue along the left lateral foot and right medial foot. Upon debridement no underlying ulceration, drainage or any signs of infection. There is evidence of dried blood on the right foot on the callus where he was picking at the callus.  Superficial abrasion type wound on the dorsal second PIPJ.  He also has psoriasis and it looks like a piece of the plaque psoriasis is come off and there is no drainage or pus and there is no significant erythema or increase in warmth or edema.  There is no probing, undermining or tunneling. No open lesions or pre-ulcerative lesions are identified. No pain with calf compression, swelling, warmth, erythema.  Assessment: Patient presents with symptomatic onychomycosis; pre-ulcerative callus, wound right foot  Plan: -Treatment options including alternatives, risks, complications were discussed -Nails sharply debrided 8 without complication/bleeding -Hyperkeratotic tissue debrided x 2 without complications or bleeding.  -Antibiotic ointment and a bandage to the wound on the right second toe daily.  If not healed the next 2 weeks to let me know otherwise I will see him back for his routine appointment. -Discussed daily foot inspection. If there are any changes, to call the office immediately.   -Follow-up in 9 weeks or sooner if any problems are to arise. In the meantime, encouraged to call the office with any questions, concerns, changes symptoms.  Celesta Gentile, DPM

## 2018-02-20 ENCOUNTER — Encounter: Payer: Self-pay | Admitting: Podiatry

## 2018-02-20 ENCOUNTER — Ambulatory Visit: Payer: Medicare Other | Admitting: Podiatry

## 2018-02-20 DIAGNOSIS — L97511 Non-pressure chronic ulcer of other part of right foot limited to breakdown of skin: Secondary | ICD-10-CM

## 2018-02-20 DIAGNOSIS — E1149 Type 2 diabetes mellitus with other diabetic neurological complication: Secondary | ICD-10-CM

## 2018-02-23 NOTE — Progress Notes (Signed)
Subjective: Michael Cantu presents the office today for follow-up evaluation of wound on the right second toe.  He states that overall he is doing better.  Denies any redness or drainage or any swelling he feels that the area is healed.  Is been continue with daily dressing changes. Denies any systemic complaints such as fevers, chills, nausea, vomiting. No acute changes since last appointment, and no other complaints at this time.   Objective: AAO x3, NAD DP/PT pulses palpable bilaterally, CRT less than 3 seconds Hyperkeratotic tissue to the dorsal aspect of the IPJ along the area the previous wound.  Upon debridement the wound appears to be healed.  There is no edema, erythema, drainage or pus there is no clinical signs of infection noted today.  Minimal hyperkeratotic tissue along the presentation side of the right foot.  No underlying ulceration or signs of infection. No open lesions or pre-ulcerative lesions.  No pain with calf compression, swelling, warmth, erythema  Assessment: Healed wound right second toe  Plan: -All treatment options discussed with the patient including all alternatives, risks, complications.  -I sharply debrided the hyperkeratotic tissue and wound to the reveal the underlying wound is healed.  Continue offloading at all times.  As a courtesy I debrided the hyperkeratotic tissue without any comp occasions or bleeding. -Daily foot inspection -Patient encouraged to call the office with any questions, concerns, change in symptoms.   Follow-up as scheduled for routine care or sooner if needed.  Trula Slade DPM

## 2018-02-28 ENCOUNTER — Other Ambulatory Visit: Payer: Self-pay

## 2018-02-28 NOTE — Patient Outreach (Signed)
[facility] Care Management  02/28/2018  Michael Cantu [birth year] [id number]   Medication Adherence call to Mr. Michael Cantu left a message for patient to call back patient is due on Lisinopril 5 mg. Michael Cantu is showing past due under [place].   [place] Management Direct Dial [phone]  Fax [phone] [email or web address]

## 2018-03-05 ENCOUNTER — Ambulatory Visit: Payer: Medicare Other | Admitting: Orthotics

## 2018-03-06 ENCOUNTER — Ambulatory Visit: Payer: Medicare Other | Admitting: Podiatry

## 2018-03-06 ENCOUNTER — Encounter: Payer: Self-pay | Admitting: Podiatry

## 2018-03-06 DIAGNOSIS — M2041 Other hammer toe(s) (acquired), right foot: Secondary | ICD-10-CM

## 2018-03-06 DIAGNOSIS — L97521 Non-pressure chronic ulcer of other part of left foot limited to breakdown of skin: Secondary | ICD-10-CM

## 2018-03-06 DIAGNOSIS — M2042 Other hammer toe(s) (acquired), left foot: Secondary | ICD-10-CM | POA: Diagnosis not present

## 2018-03-06 DIAGNOSIS — Z89411 Acquired absence of right great toe: Secondary | ICD-10-CM

## 2018-03-06 DIAGNOSIS — L84 Corns and callosities: Secondary | ICD-10-CM

## 2018-03-06 DIAGNOSIS — E1149 Type 2 diabetes mellitus with other diabetic neurological complication: Secondary | ICD-10-CM

## 2018-03-06 DIAGNOSIS — L97511 Non-pressure chronic ulcer of other part of right foot limited to breakdown of skin: Secondary | ICD-10-CM

## 2018-03-09 NOTE — Progress Notes (Signed)
Subjective: Michael Cantu presents the office today for follow-up evaluation of wound on the right second toe.  He states the area has been getting better.  He thinks it is healed.  He did try to clean his feet and noticed a callus in the right foot started to bleed.  He also has a more of a callus on his left foot and concern for a wound to the ball of his left foot.  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  He also presents today to get new diabetic shoes and inserts. Denies any systemic complaints such as fevers, chills, nausea, vomiting. No acute changes since last appointment, and no other complaints at this time.   Objective: AAO x3, NAD DP/PT pulses palpable bilaterally, CRT less than 3 seconds Hyperkeratotic tissue to the dorsal aspect of the IPJ along the area the previous wound.  Upon debridement this area is healed there is no underlying ulceration today. Hyperkeratotic lesion right medial foot along the area the previous amputation of the first ray and there is some fresh blood on the area but there is no open sore.  Appears to be like almost more of an abrasion or some other the callus come off.  There is also a new hyperkeratotic lesion left foot submetatarsal 1.  Small superficial opening present today but there is no probing, undermining or tunneling.  Is no skin erythema, ascending cellulitis.  No fluctuation or crepitation or any malodor. No open lesions or pre-ulcerative lesions.  No pain with calf compression, swelling, warmth, erythema  Assessment: Healed wound right second toe; ulceration left submetatarsal 1 ulcer callus right foot  Plan: -All treatment options discussed with the patient including all alternatives, risks, complications.  -I sharply debrided the hyperkeratotic tissue and wound to the reveal the underlying wound is healed.  I did debride the further hyperkeratotic tissue in the right foot without any complications.  Today  there is a new wound present on the left foot submetatarsal 1 I sharply debrided this down to healthy, granular tissue and this appears to be limited skin there is no signs of infection continue to monitor closely.  Hopefully the new diabetic shoes and inserts will help offload this area. Cranford Mon, CMA dispensed diabetic shoes.  They were fitting well.  Break-in instructions were discussed.  Monitor daily for any skin irritation.  Trula Slade DPM

## 2018-03-20 ENCOUNTER — Ambulatory Visit: Payer: Medicare Other | Admitting: Podiatry

## 2018-03-20 ENCOUNTER — Encounter: Payer: Self-pay | Admitting: Podiatry

## 2018-03-20 DIAGNOSIS — B351 Tinea unguium: Secondary | ICD-10-CM

## 2018-03-20 DIAGNOSIS — L97511 Non-pressure chronic ulcer of other part of right foot limited to breakdown of skin: Secondary | ICD-10-CM | POA: Diagnosis not present

## 2018-03-20 DIAGNOSIS — E1149 Type 2 diabetes mellitus with other diabetic neurological complication: Secondary | ICD-10-CM | POA: Diagnosis not present

## 2018-03-24 NOTE — Progress Notes (Signed)
Subjective: Mr. Michael Cantu presents the office today for follow-up evaluation of wound on the right second toe for areas on submetatarsal 1 of the left foot.  He states he is doing well he denies any drainage or pus or any swelling or redness.  Overall he is been doing well  he has no new concerns. Denies any systemic complaints such as fevers, chills, nausea, vomiting. No acute changes since last appointment, and no other complaints at this time.   Objective: AAO x3, NAD DP/PT pulses palpable bilaterally, CRT less than 3 seconds Hyperkeratotic tissue to the dorsal aspect of the IPJ along the area the previous wound.  Upon debridement this area is healed there is no underlying ulceration today. Hyperkeratotic lesion right medial foot along the area the previous amputation of the first ray.  No underlying ulceration, drainage or any signs of infection there is no blood present.  There is also a  hyperkeratotic lesion left foot submetatarsal 1.  Upon debridement no underlying ulceration, drainage or any signs of infection.  The wound appears to be healed. Nails are mildly hypertrophic, dystrophic and elongated without any of the nails no surrounding redness or drainage. No open lesions or pre-ulcerative lesions.  No pain with calf compression, swelling, warmth, erythema  Assessment: Resolved ulcerations bilaterally.  Plan: -All treatment options discussed with the patient including all alternatives, risks, complications.  -I sharply debrided the hyperkeratotic tissue and wound to the reveal all of the the underlying wounds have healed.  Continue offloading at all times.  Moisturizer to his feet daily. -As a courtesy I debrided the nails without any complications or bleeding.  Trula Slade DPM

## 2018-04-08 DIAGNOSIS — E782 Mixed hyperlipidemia: Secondary | ICD-10-CM | POA: Diagnosis not present

## 2018-04-08 DIAGNOSIS — E11621 Type 2 diabetes mellitus with foot ulcer: Secondary | ICD-10-CM | POA: Diagnosis not present

## 2018-04-08 DIAGNOSIS — I10 Essential (primary) hypertension: Secondary | ICD-10-CM | POA: Diagnosis not present

## 2018-04-08 DIAGNOSIS — E1165 Type 2 diabetes mellitus with hyperglycemia: Secondary | ICD-10-CM | POA: Diagnosis not present

## 2018-04-08 DIAGNOSIS — E1140 Type 2 diabetes mellitus with diabetic neuropathy, unspecified: Secondary | ICD-10-CM | POA: Diagnosis not present

## 2018-04-15 DIAGNOSIS — E1140 Type 2 diabetes mellitus with diabetic neuropathy, unspecified: Secondary | ICD-10-CM | POA: Diagnosis not present

## 2018-04-15 DIAGNOSIS — S98131A Complete traumatic amputation of one right lesser toe, initial encounter: Secondary | ICD-10-CM | POA: Diagnosis not present

## 2018-04-15 DIAGNOSIS — I10 Essential (primary) hypertension: Secondary | ICD-10-CM | POA: Diagnosis not present

## 2018-04-15 DIAGNOSIS — E782 Mixed hyperlipidemia: Secondary | ICD-10-CM | POA: Diagnosis not present

## 2018-04-15 DIAGNOSIS — E11621 Type 2 diabetes mellitus with foot ulcer: Secondary | ICD-10-CM | POA: Diagnosis not present

## 2018-05-22 ENCOUNTER — Ambulatory Visit: Payer: Medicare Other | Admitting: Podiatry

## 2018-05-22 DIAGNOSIS — M79675 Pain in left toe(s): Secondary | ICD-10-CM

## 2018-05-22 DIAGNOSIS — Q828 Other specified congenital malformations of skin: Secondary | ICD-10-CM | POA: Diagnosis not present

## 2018-05-22 DIAGNOSIS — B351 Tinea unguium: Secondary | ICD-10-CM

## 2018-05-22 DIAGNOSIS — M79674 Pain in right toe(s): Secondary | ICD-10-CM

## 2018-05-22 DIAGNOSIS — E1149 Type 2 diabetes mellitus with other diabetic neurological complication: Secondary | ICD-10-CM | POA: Diagnosis not present

## 2018-05-22 DIAGNOSIS — L84 Corns and callosities: Secondary | ICD-10-CM | POA: Diagnosis not present

## 2018-05-22 NOTE — Progress Notes (Signed)
Subjective: 63 y.o. returns the office today for painful, elongated, thickened toenails which he cannot trim himself as well as for calluses to both of his feet. Denies any redness or drainage around the nails or calluses. His wife is concerned about a callus in the ball of his left foot. Denies any open sores or drainage at this time. Denies any acute changes since last appointment and no new complaints today. Denies any systemic complaints such as fevers, chills, nausea, vomiting.   Objective: AAO 3, NAD DP/PT pulses palpable, CRT less than 3 seconds Decreased sensation with SWMF Nails hypertrophic, dystrophic, elongated, brittle, discolored 8. There is tenderness overlying the nails 1-5 on the left and 3-5 on the right.  The nails are causing irritation in shoes.  There is no surrounding redness or drainage or any signs of infection today. Hyperkeratotic tissue along the left lateral foot and right medial foot and submetatarsal 1. Upon debridement no underlying ulceration, drainage or any signs of infection. There is evidence of dried blood on the right foot on the callus where he was picking at the callus.  No open lesions or pre-ulcerative lesions are identified. No pain with calf compression, swelling, warmth, erythema.  Assessment: Patient presents with symptomatic onychomycosis; pre-ulcerative callus  Plan: -Treatment options including alternatives, risks, complications were discussed -Nails sharply debrided 8 without complication/bleeding -I modified his orthotic on the left side to offload submetatarsal 1  -Hyperkeratotic tissue debrided x 3 without complications or bleeding.  -Discussed daily foot inspection. If there are any changes, to call the office immediately.  -Follow-up in 9 weeks or sooner if any problems are to arise. In the meantime, encouraged to call the office with any questions, concerns, changes symptoms.  Celesta Gentile, DPM

## 2018-06-17 ENCOUNTER — Ambulatory Visit (INDEPENDENT_AMBULATORY_CARE_PROVIDER_SITE_OTHER): Payer: Medicare Other

## 2018-06-17 ENCOUNTER — Ambulatory Visit: Payer: Medicare Other | Admitting: Podiatry

## 2018-06-17 ENCOUNTER — Encounter: Payer: Self-pay | Admitting: Podiatry

## 2018-06-17 VITALS — Temp 96.6°F

## 2018-06-17 DIAGNOSIS — E1149 Type 2 diabetes mellitus with other diabetic neurological complication: Secondary | ICD-10-CM | POA: Diagnosis not present

## 2018-06-17 DIAGNOSIS — L97511 Non-pressure chronic ulcer of other part of right foot limited to breakdown of skin: Secondary | ICD-10-CM

## 2018-06-17 DIAGNOSIS — M2041 Other hammer toe(s) (acquired), right foot: Secondary | ICD-10-CM

## 2018-06-17 DIAGNOSIS — L84 Corns and callosities: Secondary | ICD-10-CM

## 2018-06-18 NOTE — Progress Notes (Signed)
Subjective: Michael Cantu presents the office today for concerns of a new wound in between his fourth and fifth toes on the right foot which is been ongoing for the last 2 weeks.  His wife is been trying to keep the area clean and dry.  Denies any drainage or pus coming from the area.  He said no increase in swelling to his foot denies any redness or red streaks.  He has no other concerns. Denies any systemic complaints such as fevers, chills, nausea, vomiting. No acute changes since last appointment, and no other complaints at this time.   Objective: AAO x3, NAD DP/PT pulses palpable bilaterally, CRT less than 3 seconds Sensation decreased with Semmes Weinstein monofilament. Previous right hallux amputation.  Hyperkeratotic lesion along this area and upon debridement no underlying ulceration drainage or any signs of infection.  Also hyperkeratotic lesions left foot submetatarsal 1 and lateral fifth metatarsal head.  Upon debridement no underlying ulceration drainage or any signs of infection. New superficial granular wound present along the interspace on the fourth on the right foot.  There is some mild macerated tissue.  There is no fluctuation or crepitation malodor no ascending cellulitis. No edema, erythema, increase in warmth to bilateral lower extremities.  Hammertoes present No open lesions or pre-ulcerative lesions.  No pain with calf compression, swelling, warmth, erythema  Assessment: New ulceration right fourth interspace with chronic callus formation  Plan: -All treatment options discussed with the patient including all alternatives, risks, complications.  -X-rays were obtained and reviewed.  Status post right hallux, partial first ray amputation.  Status post second metatarsal head resection.  Arthritic changes present the third MPJ.  No evidence of acute osteomyelitis. -Plan Sharp debrided the calluses, pre-ulcerative calluses x3 without any complications or bleeding. -Regards to  the wound on the right foot oriented to use a small meta Betadine to help as there is macerated tissue.  Continue daily dressing changes.  Offloading at all times. -Patient encouraged to call the office with any questions, concerns, change in symptoms.   Follow-up in 10 days or sooner if needed.  Trula Slade DPM

## 2018-06-27 ENCOUNTER — Ambulatory Visit: Payer: Medicare Other | Admitting: Podiatry

## 2018-06-27 ENCOUNTER — Encounter: Payer: Self-pay | Admitting: Podiatry

## 2018-06-27 DIAGNOSIS — E1149 Type 2 diabetes mellitus with other diabetic neurological complication: Secondary | ICD-10-CM | POA: Diagnosis not present

## 2018-06-27 DIAGNOSIS — L97511 Non-pressure chronic ulcer of other part of right foot limited to breakdown of skin: Secondary | ICD-10-CM

## 2018-06-27 DIAGNOSIS — L84 Corns and callosities: Secondary | ICD-10-CM

## 2018-06-27 DIAGNOSIS — I10 Essential (primary) hypertension: Secondary | ICD-10-CM | POA: Diagnosis not present

## 2018-06-27 DIAGNOSIS — E785 Hyperlipidemia, unspecified: Secondary | ICD-10-CM | POA: Diagnosis not present

## 2018-06-28 NOTE — Progress Notes (Signed)
Subjective: 63 year old male presents the office today with his wife for follow-up evaluation of a wound between his fourth and fifth toes of the right foot.  His wife states that she is not sure how it started but she did notice some moisture in between the area.  They have been keeping Betadine on the area and she feels the wound is almost healed.  Denies any drainage of pus or any increase in swelling or redness.  No other concerns.Denies any systemic complaints such as fevers, chills, nausea, vomiting. No acute changes since last appointment, and no other complaints at this time.   Objective: AAO x3, NAD DP/PT pulses palpable bilaterally, CRT less than 3 seconds Previous right hallux amputation there is hyperkeratotic tissue along this area.  Also mild hyperkeratotic tissue left foot submetatarsal 1 and lateral fifth metatarsal head.  Upon debridement these lesions there is no underlying ulceration drainage or any signs of infection.  The wound present in the fourth interspace is almost completely healed.  Mild hyperkeratotic tissue today.  No edema, erythema, drainage or pus or any clinical signs of infection. No open lesions or pre-ulcerative lesions.  No pain with calf compression, swelling, warmth, erythema  Assessment: Healing wound right foot   Plan: -All treatment options discussed with the patient including all alternatives, risks, complications.  -The wound appears to be doing much better on the right foot.  I debrided without any complications.  Recommend to continue with Betadine daily.  Also courtesy I also debrided both her pre-ulcerative calluses with any complications or bleeding.  Continue moisturizer and offloading. -Patient encouraged to call the office with any questions, concerns, change in symptoms.   Trula Slade DPM

## 2018-07-11 ENCOUNTER — Ambulatory Visit: Payer: Medicare Other | Admitting: Podiatry

## 2018-07-11 ENCOUNTER — Other Ambulatory Visit: Payer: Self-pay

## 2018-07-11 ENCOUNTER — Encounter: Payer: Self-pay | Admitting: Podiatry

## 2018-07-11 DIAGNOSIS — L84 Corns and callosities: Secondary | ICD-10-CM | POA: Diagnosis not present

## 2018-07-11 DIAGNOSIS — M79674 Pain in right toe(s): Secondary | ICD-10-CM

## 2018-07-11 DIAGNOSIS — L97511 Non-pressure chronic ulcer of other part of right foot limited to breakdown of skin: Secondary | ICD-10-CM | POA: Diagnosis not present

## 2018-07-11 DIAGNOSIS — B351 Tinea unguium: Secondary | ICD-10-CM | POA: Diagnosis not present

## 2018-07-11 DIAGNOSIS — M79675 Pain in left toe(s): Secondary | ICD-10-CM

## 2018-07-11 DIAGNOSIS — E1149 Type 2 diabetes mellitus with other diabetic neurological complication: Secondary | ICD-10-CM | POA: Diagnosis not present

## 2018-07-20 NOTE — Progress Notes (Signed)
Subjective: 63 y.o. returns the office today for follow-up evaluation of a wound of the fourth interspace on the right foot as well as for thick calluses painful elongated toenails he cannot trim himself.  Denies any redness or drainage or any swelling to the areas.  Overall he states the wound on the right foot is been doing much better almost healed his wife states.  No drainage or pus.  No increase in swelling.  No redness. Denies any acute changes since last appointment and no new complaints today. Denies any systemic complaints such as fevers, chills, nausea, vomiting.   Objective: AAO 3, NAD DP/PT pulses palpable, CRT less than 3 seconds Decreased sensation with SWMF Nails hypertrophic, dystrophic, elongated, brittle, discolored 8. There is tenderness overlying the nails 1-5 on the left and 3-5 on the right. There is no surrounding erythema or drainage along the nail sites. Hyperkeratotic tissue along the left lateral foot and right medial foot. Upon debridement no underlying ulceration, drainage or any signs of infection. There is evidence of dried blood on the right foot on the callus where he was picking at the callus.  Superficial wound present in the right foot fourth interspace with some macerated tissue.  The wound base is granular.  There is approximate 0.2 x 0.2 cm.  No surrounding erythema, ascending cellulitis.  No fluctuation of crepitation malodor. No open lesions or pre-ulcerative lesions are identified. Overall doing well without any significant change.  No other areas of tenderness bilateral lower extremities. No overlying edema, erythema, increased warmth. No pain with calf compression, swelling, warmth, erythema.  Assessment: Patient presents with symptomatic onychomycosis; pre-ulcerative callus; healing wound right foot  Plan: -Treatment options including alternatives, risks, complications were discussed -Nails sharply debrided 8 without  complication/bleeding -Hyperkeratotic tissue debrided x 2 without complications or bleeding.  -Recommend continue with Betadine to the wound on the right foot daily.  Monitor for any worsening. -Discussed daily foot inspection. If there are any changes, to call the office immediately.  -Monitor for any clinical signs or symptoms of infection and directed to call the office immediately should any occur or go to the ER.  Follow-up scheduled for wound check or sooner if needed.  Celesta Gentile, DPM

## 2018-07-24 ENCOUNTER — Ambulatory Visit: Payer: Medicare Other | Admitting: Podiatry

## 2018-07-25 ENCOUNTER — Ambulatory Visit: Payer: Medicare Other | Admitting: Podiatry

## 2018-08-29 ENCOUNTER — Other Ambulatory Visit: Payer: Self-pay

## 2018-08-29 ENCOUNTER — Ambulatory Visit: Payer: Medicare Other | Admitting: Podiatry

## 2018-08-29 VITALS — Temp 97.5°F

## 2018-08-29 DIAGNOSIS — E1149 Type 2 diabetes mellitus with other diabetic neurological complication: Secondary | ICD-10-CM

## 2018-08-29 DIAGNOSIS — L97511 Non-pressure chronic ulcer of other part of right foot limited to breakdown of skin: Secondary | ICD-10-CM

## 2018-08-29 DIAGNOSIS — B351 Tinea unguium: Secondary | ICD-10-CM | POA: Diagnosis not present

## 2018-08-29 DIAGNOSIS — L84 Corns and callosities: Secondary | ICD-10-CM

## 2018-09-02 NOTE — Progress Notes (Signed)
Subjective: 63 year old male presents the office today for follow-up evaluation of a wound of the fourth interspace on the right foot.  He states it is healing well and he has not had any drainage or pus coming from the area.  No increase in swelling or redness to his foot.  He has no new concerns today. Denies any systemic complaints such as fevers, chills, nausea, vomiting. No acute changes since last appointment, and no other complaints at this time.   Objective: AAO x3, NAD DP/PT pulses palpable bilaterally, CRT less than 3 seconds The wound on the fourth interspace appears to be healing well.  Minimal macerated tissue area is superficial and almost healed. Erythema, ascending cellulitis.  There is no fluctuation crepitation any malodor.  Thick hyperkeratotic lesions present left foot submetatarsal 1, fifth metatarsal head as well as the medial aspect of the right foot on the previous partial first mutation.  Upon debridement there is no ongoing ulceration drainage or any signs of infection.  Nails are mildly elongated and hypertrophic.  There is no pain in the nails there is no streaking redness or drainage or any signs of infection of the toenail sites. No open lesions or pre-ulcerative lesions.  No pain with calf compression, swelling, warmth, erythema  Assessment: Healing wound right fourth interspace, hyperkeratotic lesions  Plan: -All treatment options discussed with the patient including all alternatives, risks, complications.  -Wound is healing.  I would recommend continue with Betadine to the area daily.  Monitor for any signs or symptoms of infection -As a courtesy I debrided the calluses x3 without any complications or bleeding also debrided the nails without any complications or bleeding. -Patient encouraged to call the office with any questions, concerns, change in symptoms.   RTC 9 weeks or sooner if needed.  If the wound is not healed in the next couple weeks or causes any  issues to let me know prior.  Trula Slade DPM

## 2018-09-18 ENCOUNTER — Ambulatory Visit: Payer: Medicare Other | Admitting: Orthotics

## 2018-09-18 ENCOUNTER — Other Ambulatory Visit: Payer: Self-pay

## 2018-09-18 DIAGNOSIS — L84 Corns and callosities: Secondary | ICD-10-CM

## 2018-09-18 DIAGNOSIS — L97511 Non-pressure chronic ulcer of other part of right foot limited to breakdown of skin: Secondary | ICD-10-CM

## 2018-09-18 DIAGNOSIS — E1149 Type 2 diabetes mellitus with other diabetic neurological complication: Secondary | ICD-10-CM

## 2018-09-18 DIAGNOSIS — M2041 Other hammer toe(s) (acquired), right foot: Secondary | ICD-10-CM

## 2018-09-18 DIAGNOSIS — Z89411 Acquired absence of right great toe: Secondary | ICD-10-CM

## 2018-09-18 NOTE — Progress Notes (Signed)
Patient presents today for diabetic shoe measurement and foam casting.  Goals of diabetic shoes/inserts to offer protection from conditions secondary to DM2, offer relief from sheer forces that could lead to ulcerations, protect the foot, and offer greater stability. Patient is under supervision of DPM wagoner Physician managing patients DM2: burdine Patient has following documented conditions to qualify for diabetic shoes/inserts: Patient measured with brannock device:  11xw

## 2018-09-26 DIAGNOSIS — E1165 Type 2 diabetes mellitus with hyperglycemia: Secondary | ICD-10-CM | POA: Diagnosis not present

## 2018-09-26 DIAGNOSIS — I10 Essential (primary) hypertension: Secondary | ICD-10-CM | POA: Diagnosis not present

## 2018-09-26 DIAGNOSIS — E782 Mixed hyperlipidemia: Secondary | ICD-10-CM | POA: Diagnosis not present

## 2018-09-26 DIAGNOSIS — D519 Vitamin B12 deficiency anemia, unspecified: Secondary | ICD-10-CM | POA: Diagnosis not present

## 2018-09-26 DIAGNOSIS — E11621 Type 2 diabetes mellitus with foot ulcer: Secondary | ICD-10-CM | POA: Diagnosis not present

## 2018-10-01 DIAGNOSIS — S98131A Complete traumatic amputation of one right lesser toe, initial encounter: Secondary | ICD-10-CM | POA: Diagnosis not present

## 2018-10-01 DIAGNOSIS — I739 Peripheral vascular disease, unspecified: Secondary | ICD-10-CM | POA: Diagnosis not present

## 2018-10-01 DIAGNOSIS — E782 Mixed hyperlipidemia: Secondary | ICD-10-CM | POA: Diagnosis not present

## 2018-10-01 DIAGNOSIS — E1140 Type 2 diabetes mellitus with diabetic neuropathy, unspecified: Secondary | ICD-10-CM | POA: Diagnosis not present

## 2018-10-01 DIAGNOSIS — I10 Essential (primary) hypertension: Secondary | ICD-10-CM | POA: Diagnosis not present

## 2018-10-13 ENCOUNTER — Ambulatory Visit: Payer: Medicare Other | Admitting: Orthotics

## 2018-10-21 ENCOUNTER — Other Ambulatory Visit: Payer: Self-pay

## 2018-10-21 ENCOUNTER — Ambulatory Visit (INDEPENDENT_AMBULATORY_CARE_PROVIDER_SITE_OTHER): Payer: Medicare Other | Admitting: Orthotics

## 2018-10-21 DIAGNOSIS — M2041 Other hammer toe(s) (acquired), right foot: Secondary | ICD-10-CM | POA: Diagnosis not present

## 2018-10-21 DIAGNOSIS — E1149 Type 2 diabetes mellitus with other diabetic neurological complication: Secondary | ICD-10-CM | POA: Diagnosis not present

## 2018-10-21 DIAGNOSIS — L84 Corns and callosities: Secondary | ICD-10-CM

## 2018-10-21 DIAGNOSIS — Z89411 Acquired absence of right great toe: Secondary | ICD-10-CM

## 2018-10-30 ENCOUNTER — Other Ambulatory Visit: Payer: Self-pay

## 2018-10-30 ENCOUNTER — Ambulatory Visit: Payer: Medicare Other | Admitting: Podiatry

## 2018-10-30 ENCOUNTER — Encounter: Payer: Self-pay | Admitting: Podiatry

## 2018-10-30 DIAGNOSIS — E1149 Type 2 diabetes mellitus with other diabetic neurological complication: Secondary | ICD-10-CM | POA: Diagnosis not present

## 2018-10-30 DIAGNOSIS — M79674 Pain in right toe(s): Secondary | ICD-10-CM | POA: Diagnosis not present

## 2018-10-30 DIAGNOSIS — L97521 Non-pressure chronic ulcer of other part of left foot limited to breakdown of skin: Secondary | ICD-10-CM | POA: Diagnosis not present

## 2018-10-30 DIAGNOSIS — Q828 Other specified congenital malformations of skin: Secondary | ICD-10-CM | POA: Diagnosis not present

## 2018-10-30 DIAGNOSIS — M79675 Pain in left toe(s): Secondary | ICD-10-CM | POA: Diagnosis not present

## 2018-10-30 DIAGNOSIS — L84 Corns and callosities: Secondary | ICD-10-CM

## 2018-10-30 DIAGNOSIS — B351 Tinea unguium: Secondary | ICD-10-CM

## 2018-11-11 NOTE — Progress Notes (Signed)
Subjective: 63 y.o. returns the office today for painful, elongated, thickened toenails which he cannot trim himself as well as for calluses to both of his feet.  He states that the wound on the right foot is healed.  The callus in the left foot has become thick he has noted some blood underneath the area.  Denies any drainage or pus or any swelling or redness.  He has no other concerns today. Denies any systemic complaints such as fevers, chills, nausea, vomiting.   Objective: AAO 3, NAD DP/PT pulses palpable, CRT less than 3 seconds Decreased sensation with SWMF Nails hypertrophic, dystrophic, elongated, brittle, discolored 8. There is tenderness overlying the nails 1-5 on the left and 3-5 on the right.  The nails are causing irritation in shoes.  There is no surrounding redness or drainage or any signs of infection today. Hyperkeratotic tissue along the left lateral foot and right medial foot and submetatarsal 1.  Upon debridement of the left foot there is superficial.  Skin breakdown.  There is no probing, undermining or tunneling.  There is no surrounding erythema, ascending cellulitis.  There is no fluctuation crepitation any malodor.  The wound was interdigitally in the right foot is healed. No pain with calf compression, swelling, warmth, erythema.  Assessment: Patient presents with symptomatic onychomycosis; pre-ulcerative callus  Plan: -Treatment options including alternatives, risks, complications were discussed -Nails sharply debrided 8 without complication/bleeding -Hyperkeratotic tissue debrided x 3.  Upon debridement of the left submetatarsal 1 there is a superficial wound.  It is healthy, granular tissue.  Continue antibiotic ointment dressing changes daily offloading.  His orthotic was modified to offload this by Liliane Channel. -Discussed daily foot inspection. If there are any changes, to call the office immediately.  -Follow-up in 9 weeks or sooner if any problems are to arise. In the  meantime, encouraged to call the office with any questions, concerns, changes symptoms.  Celesta Gentile, DPM

## 2018-11-20 ENCOUNTER — Ambulatory Visit (INDEPENDENT_AMBULATORY_CARE_PROVIDER_SITE_OTHER): Payer: Medicare Other

## 2018-11-20 ENCOUNTER — Ambulatory Visit: Payer: Medicare Other | Admitting: Podiatry

## 2018-11-20 ENCOUNTER — Other Ambulatory Visit: Payer: Self-pay

## 2018-11-20 DIAGNOSIS — E1149 Type 2 diabetes mellitus with other diabetic neurological complication: Secondary | ICD-10-CM | POA: Diagnosis not present

## 2018-11-20 DIAGNOSIS — L97529 Non-pressure chronic ulcer of other part of left foot with unspecified severity: Secondary | ICD-10-CM

## 2018-11-20 DIAGNOSIS — L97521 Non-pressure chronic ulcer of other part of left foot limited to breakdown of skin: Secondary | ICD-10-CM

## 2018-11-20 DIAGNOSIS — L03116 Cellulitis of left lower limb: Secondary | ICD-10-CM

## 2018-11-20 DIAGNOSIS — M79672 Pain in left foot: Secondary | ICD-10-CM

## 2018-11-20 MED ORDER — DOXYCYCLINE HYCLATE 100 MG PO TABS: 100.0000 mg | ORAL_TABLET | Freq: Two times a day (BID) | ORAL | 0 refills | Status: AC

## 2018-11-21 ENCOUNTER — Other Ambulatory Visit: Payer: Self-pay | Admitting: Podiatry

## 2018-11-21 DIAGNOSIS — L97529 Non-pressure chronic ulcer of other part of left foot with unspecified severity: Secondary | ICD-10-CM

## 2018-11-23 LAB — WOUND CULTURE
MICRO NUMBER:: 698140
SPECIMEN QUALITY:: ADEQUATE

## 2018-11-27 ENCOUNTER — Telehealth: Payer: Self-pay | Admitting: *Deleted

## 2018-11-27 NOTE — Telephone Encounter (Signed)
Tried to call the patient and the patient's phone has calling restrictions on it. Michael Cantu

## 2018-11-27 NOTE — Progress Notes (Signed)
Subjective: 63 year old male presents the office today for concerns of left foot injury.  States he was mowing the slip of the mower injured his left foot.  He states he has noticed increased redness and swelling to his left foot he has no drainage. Denies any systemic complaints such as fevers, chills, nausea, vomiting. No acute changes since last appointment, and no other complaints at this time.   He and his wife recently separated and he is upset about this.   Objective: AAO x3, NAD DP/PT pulses palpable bilaterally, CRT less than 3 seconds Ulceration left foot submetatarsal 1.  There is localized edema and there is cellulitis present on the first MPJ today.  There is no fluctuation crepitation.  There is no purulence identified but still not clear bloody drainage.  No significant malodor.  There is a slight streak coming from the wound. No open lesions or pre-ulcerative lesions.  No pain with calf compression, swelling, warmth, erythema  Assessment: Left foot cellulitis, wound infection  Plan: -All treatment options discussed with the patient including all alternatives, risks, complications.  -X-rays obtained reviewed.  No evidence of acute fracture or osteomyelitis or soft tissue emphysema at this time. -I debrided the wound on the left foot any complications utilizing the 312 with scalpel.  Recommend antibiotic ointment dressing changes daily.  Prescribed doxycycline.  Surgical shoe and offloading and elevation.  There is any worsening he must report immediately to the emergency department. -Wound culture obtained -RTC 1 week or sooner if needed  Trula Slade DPM

## 2018-11-28 DIAGNOSIS — E785 Hyperlipidemia, unspecified: Secondary | ICD-10-CM | POA: Diagnosis not present

## 2018-11-28 DIAGNOSIS — I10 Essential (primary) hypertension: Secondary | ICD-10-CM | POA: Diagnosis not present

## 2018-12-04 ENCOUNTER — Ambulatory Visit: Payer: Medicare Other | Admitting: Podiatry

## 2018-12-16 ENCOUNTER — Other Ambulatory Visit: Payer: Self-pay

## 2018-12-16 NOTE — Patient Outreach (Signed)
[facility]) Care Management  12/16/2018  Michael Cantu 07/12/1955 [id number]   Medication Adherence call to Michael Cantu Compliant Voice message left with a call back number. Michael Cantu is showing past due on Simvastatin 10 mg and Lisinopril 5 mg under Michael Cantu.  [place] Management Direct Dial [phone]  Fax [phone] [email or web address]

## 2018-12-26 DIAGNOSIS — I10 Essential (primary) hypertension: Secondary | ICD-10-CM | POA: Diagnosis not present

## 2018-12-26 DIAGNOSIS — E782 Mixed hyperlipidemia: Secondary | ICD-10-CM | POA: Diagnosis not present

## 2018-12-26 DIAGNOSIS — E1165 Type 2 diabetes mellitus with hyperglycemia: Secondary | ICD-10-CM | POA: Diagnosis not present

## 2018-12-26 DIAGNOSIS — D72829 Elevated white blood cell count, unspecified: Secondary | ICD-10-CM | POA: Diagnosis not present

## 2018-12-30 DIAGNOSIS — I10 Essential (primary) hypertension: Secondary | ICD-10-CM | POA: Diagnosis not present

## 2018-12-30 DIAGNOSIS — E1140 Type 2 diabetes mellitus with diabetic neuropathy, unspecified: Secondary | ICD-10-CM | POA: Diagnosis not present

## 2018-12-30 DIAGNOSIS — S98111A Complete traumatic amputation of right great toe, initial encounter: Secondary | ICD-10-CM | POA: Diagnosis not present

## 2018-12-30 DIAGNOSIS — I739 Peripheral vascular disease, unspecified: Secondary | ICD-10-CM | POA: Diagnosis not present

## 2018-12-30 DIAGNOSIS — Z23 Encounter for immunization: Secondary | ICD-10-CM | POA: Diagnosis not present

## 2018-12-30 DIAGNOSIS — D72829 Elevated white blood cell count, unspecified: Secondary | ICD-10-CM | POA: Diagnosis not present

## 2019-01-28 DIAGNOSIS — I10 Essential (primary) hypertension: Secondary | ICD-10-CM | POA: Diagnosis not present

## 2019-01-28 DIAGNOSIS — E1140 Type 2 diabetes mellitus with diabetic neuropathy, unspecified: Secondary | ICD-10-CM | POA: Diagnosis not present

## 2019-01-28 DIAGNOSIS — E785 Hyperlipidemia, unspecified: Secondary | ICD-10-CM | POA: Diagnosis not present

## 2019-02-27 DIAGNOSIS — E782 Mixed hyperlipidemia: Secondary | ICD-10-CM | POA: Diagnosis not present

## 2019-02-27 DIAGNOSIS — I10 Essential (primary) hypertension: Secondary | ICD-10-CM | POA: Diagnosis not present

## 2019-02-27 DIAGNOSIS — E1140 Type 2 diabetes mellitus with diabetic neuropathy, unspecified: Secondary | ICD-10-CM | POA: Diagnosis not present

## 2019-03-11 DIAGNOSIS — I739 Peripheral vascular disease, unspecified: Secondary | ICD-10-CM | POA: Diagnosis not present

## 2019-03-11 DIAGNOSIS — E7800 Pure hypercholesterolemia, unspecified: Secondary | ICD-10-CM | POA: Diagnosis not present

## 2019-03-11 DIAGNOSIS — Z794 Long term (current) use of insulin: Secondary | ICD-10-CM | POA: Diagnosis not present

## 2019-03-11 DIAGNOSIS — E1169 Type 2 diabetes mellitus with other specified complication: Secondary | ICD-10-CM | POA: Diagnosis not present

## 2019-03-11 DIAGNOSIS — R238 Other skin changes: Secondary | ICD-10-CM | POA: Diagnosis not present

## 2019-03-11 DIAGNOSIS — M869 Osteomyelitis, unspecified: Secondary | ICD-10-CM | POA: Diagnosis not present

## 2019-03-11 DIAGNOSIS — L97529 Non-pressure chronic ulcer of other part of left foot with unspecified severity: Secondary | ICD-10-CM | POA: Diagnosis not present

## 2019-03-11 DIAGNOSIS — E119 Type 2 diabetes mellitus without complications: Secondary | ICD-10-CM | POA: Diagnosis not present

## 2019-03-11 DIAGNOSIS — L089 Local infection of the skin and subcutaneous tissue, unspecified: Secondary | ICD-10-CM | POA: Diagnosis not present

## 2019-03-11 DIAGNOSIS — I10 Essential (primary) hypertension: Secondary | ICD-10-CM | POA: Diagnosis not present

## 2019-03-11 DIAGNOSIS — L97509 Non-pressure chronic ulcer of other part of unspecified foot with unspecified severity: Secondary | ICD-10-CM | POA: Diagnosis not present

## 2019-03-11 DIAGNOSIS — M86172 Other acute osteomyelitis, left ankle and foot: Secondary | ICD-10-CM | POA: Diagnosis not present

## 2019-03-11 DIAGNOSIS — E1140 Type 2 diabetes mellitus with diabetic neuropathy, unspecified: Secondary | ICD-10-CM | POA: Diagnosis not present

## 2019-03-11 DIAGNOSIS — L97524 Non-pressure chronic ulcer of other part of left foot with necrosis of bone: Secondary | ICD-10-CM | POA: Diagnosis not present

## 2019-03-11 DIAGNOSIS — Z833 Family history of diabetes mellitus: Secondary | ICD-10-CM | POA: Diagnosis not present

## 2019-03-11 DIAGNOSIS — Z89421 Acquired absence of other right toe(s): Secondary | ICD-10-CM | POA: Diagnosis not present

## 2019-03-11 DIAGNOSIS — Z8679 Personal history of other diseases of the circulatory system: Secondary | ICD-10-CM | POA: Diagnosis not present

## 2019-03-11 DIAGNOSIS — E11621 Type 2 diabetes mellitus with foot ulcer: Secondary | ICD-10-CM | POA: Diagnosis not present

## 2019-03-11 DIAGNOSIS — Z89411 Acquired absence of right great toe: Secondary | ICD-10-CM | POA: Diagnosis not present

## 2019-03-11 DIAGNOSIS — Z5329 Procedure and treatment not carried out because of patient's decision for other reasons: Secondary | ICD-10-CM | POA: Diagnosis not present

## 2019-03-11 DIAGNOSIS — E09628 Drug or chemical induced diabetes mellitus with other skin complications: Secondary | ICD-10-CM | POA: Diagnosis not present

## 2019-03-27 DIAGNOSIS — E11621 Type 2 diabetes mellitus with foot ulcer: Secondary | ICD-10-CM | POA: Diagnosis not present

## 2019-03-27 DIAGNOSIS — E1140 Type 2 diabetes mellitus with diabetic neuropathy, unspecified: Secondary | ICD-10-CM | POA: Diagnosis not present

## 2019-03-27 DIAGNOSIS — D519 Vitamin B12 deficiency anemia, unspecified: Secondary | ICD-10-CM | POA: Diagnosis not present

## 2019-03-27 DIAGNOSIS — I10 Essential (primary) hypertension: Secondary | ICD-10-CM | POA: Diagnosis not present

## 2019-03-27 DIAGNOSIS — E782 Mixed hyperlipidemia: Secondary | ICD-10-CM | POA: Diagnosis not present

## 2019-03-30 DIAGNOSIS — E782 Mixed hyperlipidemia: Secondary | ICD-10-CM | POA: Diagnosis not present

## 2019-04-16 DIAGNOSIS — E11621 Type 2 diabetes mellitus with foot ulcer: Secondary | ICD-10-CM | POA: Diagnosis not present

## 2019-04-16 DIAGNOSIS — L03116 Cellulitis of left lower limb: Secondary | ICD-10-CM | POA: Diagnosis not present

## 2019-04-16 DIAGNOSIS — Z89422 Acquired absence of other left toe(s): Secondary | ICD-10-CM | POA: Diagnosis not present

## 2019-04-16 DIAGNOSIS — E1140 Type 2 diabetes mellitus with diabetic neuropathy, unspecified: Secondary | ICD-10-CM | POA: Diagnosis not present

## 2019-04-21 DIAGNOSIS — S91302A Unspecified open wound, left foot, initial encounter: Secondary | ICD-10-CM | POA: Diagnosis not present

## 2019-04-21 DIAGNOSIS — E119 Type 2 diabetes mellitus without complications: Secondary | ICD-10-CM | POA: Diagnosis not present

## 2019-04-21 DIAGNOSIS — D72829 Elevated white blood cell count, unspecified: Secondary | ICD-10-CM | POA: Diagnosis not present

## 2019-04-21 DIAGNOSIS — T8750 Necrosis of amputation stump, unspecified extremity: Secondary | ICD-10-CM | POA: Diagnosis not present

## 2019-04-21 DIAGNOSIS — M86171 Other acute osteomyelitis, right ankle and foot: Secondary | ICD-10-CM | POA: Diagnosis not present

## 2019-04-21 DIAGNOSIS — Z89411 Acquired absence of right great toe: Secondary | ICD-10-CM | POA: Diagnosis not present

## 2019-04-21 DIAGNOSIS — Z89422 Acquired absence of other left toe(s): Secondary | ICD-10-CM | POA: Diagnosis not present

## 2019-04-21 DIAGNOSIS — R600 Localized edema: Secondary | ICD-10-CM | POA: Diagnosis not present

## 2019-04-21 DIAGNOSIS — Z794 Long term (current) use of insulin: Secondary | ICD-10-CM | POA: Diagnosis not present

## 2019-04-21 DIAGNOSIS — M86172 Other acute osteomyelitis, left ankle and foot: Secondary | ICD-10-CM | POA: Diagnosis not present

## 2019-04-21 DIAGNOSIS — Z89421 Acquired absence of other right toe(s): Secondary | ICD-10-CM | POA: Diagnosis not present

## 2019-04-21 DIAGNOSIS — B965 Pseudomonas (aeruginosa) (mallei) (pseudomallei) as the cause of diseases classified elsewhere: Secondary | ICD-10-CM | POA: Diagnosis not present

## 2019-04-21 DIAGNOSIS — I10 Essential (primary) hypertension: Secondary | ICD-10-CM | POA: Diagnosis not present

## 2019-04-21 DIAGNOSIS — E785 Hyperlipidemia, unspecified: Secondary | ICD-10-CM | POA: Diagnosis not present

## 2019-04-21 DIAGNOSIS — M869 Osteomyelitis, unspecified: Secondary | ICD-10-CM | POA: Diagnosis not present

## 2019-04-21 DIAGNOSIS — E1169 Type 2 diabetes mellitus with other specified complication: Secondary | ICD-10-CM | POA: Diagnosis not present

## 2019-04-21 DIAGNOSIS — Z4889 Encounter for other specified surgical aftercare: Secondary | ICD-10-CM | POA: Diagnosis not present

## 2019-04-21 DIAGNOSIS — Z833 Family history of diabetes mellitus: Secondary | ICD-10-CM | POA: Diagnosis not present

## 2019-04-21 DIAGNOSIS — E7800 Pure hypercholesterolemia, unspecified: Secondary | ICD-10-CM | POA: Diagnosis not present

## 2019-04-27 DIAGNOSIS — M869 Osteomyelitis, unspecified: Secondary | ICD-10-CM | POA: Diagnosis not present

## 2019-04-30 DIAGNOSIS — E119 Type 2 diabetes mellitus without complications: Secondary | ICD-10-CM | POA: Diagnosis not present

## 2019-04-30 DIAGNOSIS — E782 Mixed hyperlipidemia: Secondary | ICD-10-CM | POA: Diagnosis not present

## 2019-04-30 DIAGNOSIS — I10 Essential (primary) hypertension: Secondary | ICD-10-CM | POA: Diagnosis not present

## 2019-04-30 DIAGNOSIS — L84 Corns and callosities: Secondary | ICD-10-CM | POA: Diagnosis not present

## 2019-04-30 DIAGNOSIS — M869 Osteomyelitis, unspecified: Secondary | ICD-10-CM | POA: Diagnosis not present

## 2019-06-01 DEATH — deceased
